# Patient Record
Sex: Female | Born: 1970
Health system: Southern US, Community
[De-identification: ages and names within clinical notes are randomized; demographics above are authoritative.]

## PROBLEM LIST (undated history)

## (undated) DIAGNOSIS — R112 Nausea with vomiting, unspecified: Secondary | ICD-10-CM

## (undated) DIAGNOSIS — Z9889 Other specified postprocedural states: Secondary | ICD-10-CM

## (undated) DIAGNOSIS — N83209 Unspecified ovarian cyst, unspecified side: Secondary | ICD-10-CM

## (undated) DIAGNOSIS — I471 Supraventricular tachycardia: Secondary | ICD-10-CM

## (undated) DIAGNOSIS — R5383 Other fatigue: Secondary | ICD-10-CM

## (undated) DIAGNOSIS — I4719 Other supraventricular tachycardia: Secondary | ICD-10-CM

## (undated) DIAGNOSIS — E2749 Other adrenocortical insufficiency: Secondary | ICD-10-CM

## (undated) DIAGNOSIS — J45909 Unspecified asthma, uncomplicated: Secondary | ICD-10-CM

## (undated) HISTORY — PX: TUBAL LIGATION: SHX77

## (undated) HISTORY — PX: OTHER SURGICAL HISTORY: SHX169

## (undated) HISTORY — PX: BREAST SURGERY: SHX581

## (undated) HISTORY — PX: CARDIAC ELECTROPHYSIOLOGY STUDY AND ABLATION: SHX1294

## (undated) HISTORY — PX: ANTERIOR CRUCIATE LIGAMENT REPAIR: SHX115

## (undated) HISTORY — PX: MYRINGOTOMY: SUR874

## (undated) HISTORY — PX: TONSILLECTOMY AND ADENOIDECTOMY: SUR1326

## (undated) HISTORY — PX: WISDOM TOOTH EXTRACTION: SHX21

## (undated) HISTORY — PX: NASAL SEPTUM SURGERY: SHX37

---

## 1997-09-22 ENCOUNTER — Other Ambulatory Visit: Admission: RE | Admit: 1997-09-22 | Discharge: 1997-09-22 | Payer: Self-pay | Admitting: Obstetrics & Gynecology

## 1999-10-01 ENCOUNTER — Other Ambulatory Visit: Admission: RE | Admit: 1999-10-01 | Discharge: 1999-10-01 | Payer: Self-pay | Admitting: Obstetrics and Gynecology

## 1999-11-21 ENCOUNTER — Inpatient Hospital Stay (HOSPITAL_COMMUNITY): Admission: EM | Admit: 1999-11-21 | Discharge: 1999-11-24 | Payer: Self-pay

## 1999-11-21 ENCOUNTER — Encounter: Payer: Self-pay | Admitting: Surgery

## 1999-11-21 ENCOUNTER — Encounter: Payer: Self-pay | Admitting: Emergency Medicine

## 1999-11-22 ENCOUNTER — Encounter: Payer: Self-pay | Admitting: General Surgery

## 1999-11-23 ENCOUNTER — Encounter: Payer: Self-pay | Admitting: General Surgery

## 1999-11-29 ENCOUNTER — Encounter: Payer: Self-pay | Admitting: Neurosurgery

## 1999-11-29 ENCOUNTER — Ambulatory Visit (HOSPITAL_COMMUNITY): Admission: RE | Admit: 1999-11-29 | Discharge: 1999-11-29 | Payer: Self-pay | Admitting: Neurosurgery

## 1999-12-19 ENCOUNTER — Ambulatory Visit (HOSPITAL_COMMUNITY): Admission: RE | Admit: 1999-12-19 | Discharge: 1999-12-19 | Payer: Self-pay | Admitting: Neurosurgery

## 1999-12-19 ENCOUNTER — Encounter: Payer: Self-pay | Admitting: Neurosurgery

## 2000-01-04 ENCOUNTER — Encounter: Payer: Self-pay | Admitting: Orthopedic Surgery

## 2000-01-04 ENCOUNTER — Ambulatory Visit (HOSPITAL_COMMUNITY): Admission: RE | Admit: 2000-01-04 | Discharge: 2000-01-04 | Payer: Self-pay | Admitting: Orthopedic Surgery

## 2000-07-21 ENCOUNTER — Encounter: Payer: Self-pay | Admitting: Obstetrics and Gynecology

## 2000-07-21 ENCOUNTER — Ambulatory Visit (HOSPITAL_COMMUNITY): Admission: RE | Admit: 2000-07-21 | Discharge: 2000-07-21 | Payer: Self-pay | Admitting: Obstetrics and Gynecology

## 2000-09-30 ENCOUNTER — Ambulatory Visit (HOSPITAL_COMMUNITY): Admission: RE | Admit: 2000-09-30 | Discharge: 2000-09-30 | Payer: Self-pay | Admitting: Obstetrics and Gynecology

## 2000-09-30 ENCOUNTER — Inpatient Hospital Stay (HOSPITAL_COMMUNITY): Admission: AD | Admit: 2000-09-30 | Discharge: 2000-09-30 | Payer: Self-pay | Admitting: *Deleted

## 2000-11-16 ENCOUNTER — Inpatient Hospital Stay (HOSPITAL_COMMUNITY): Admission: AD | Admit: 2000-11-16 | Discharge: 2000-11-16 | Payer: Self-pay | Admitting: Obstetrics and Gynecology

## 2000-11-21 ENCOUNTER — Inpatient Hospital Stay (HOSPITAL_COMMUNITY): Admission: AD | Admit: 2000-11-21 | Discharge: 2000-11-21 | Payer: Self-pay | Admitting: Obstetrics and Gynecology

## 2000-11-29 ENCOUNTER — Inpatient Hospital Stay (HOSPITAL_COMMUNITY): Admission: AD | Admit: 2000-11-29 | Discharge: 2000-12-01 | Payer: Self-pay | Admitting: Obstetrics and Gynecology

## 2000-12-04 ENCOUNTER — Encounter: Admission: RE | Admit: 2000-12-04 | Discharge: 2000-12-17 | Payer: Self-pay | Admitting: Obstetrics and Gynecology

## 2001-01-15 ENCOUNTER — Other Ambulatory Visit: Admission: RE | Admit: 2001-01-15 | Discharge: 2001-01-15 | Payer: Self-pay | Admitting: Obstetrics and Gynecology

## 2001-07-31 ENCOUNTER — Ambulatory Visit (HOSPITAL_COMMUNITY): Admission: RE | Admit: 2001-07-31 | Discharge: 2001-07-31 | Payer: Self-pay | Admitting: Neurosurgery

## 2001-07-31 ENCOUNTER — Encounter: Payer: Self-pay | Admitting: Neurosurgery

## 2001-09-25 ENCOUNTER — Ambulatory Visit (HOSPITAL_COMMUNITY): Admission: RE | Admit: 2001-09-25 | Discharge: 2001-09-25 | Payer: Self-pay | Admitting: Obstetrics and Gynecology

## 2001-09-25 ENCOUNTER — Encounter: Payer: Self-pay | Admitting: Obstetrics and Gynecology

## 2002-11-25 ENCOUNTER — Encounter: Payer: Self-pay | Admitting: Emergency Medicine

## 2002-11-25 ENCOUNTER — Emergency Department (HOSPITAL_COMMUNITY): Admission: EM | Admit: 2002-11-25 | Discharge: 2002-11-25 | Payer: Self-pay | Admitting: Emergency Medicine

## 2003-01-03 ENCOUNTER — Other Ambulatory Visit: Admission: RE | Admit: 2003-01-03 | Discharge: 2003-01-03 | Payer: Self-pay | Admitting: Obstetrics and Gynecology

## 2003-04-27 ENCOUNTER — Inpatient Hospital Stay (HOSPITAL_COMMUNITY): Admission: AD | Admit: 2003-04-27 | Discharge: 2003-04-27 | Payer: Self-pay | Admitting: Obstetrics and Gynecology

## 2003-05-03 ENCOUNTER — Ambulatory Visit (HOSPITAL_COMMUNITY): Admission: RE | Admit: 2003-05-03 | Discharge: 2003-05-03 | Payer: Self-pay | Admitting: Obstetrics and Gynecology

## 2003-05-29 ENCOUNTER — Inpatient Hospital Stay (HOSPITAL_COMMUNITY): Admission: AD | Admit: 2003-05-29 | Discharge: 2003-05-29 | Payer: Self-pay | Admitting: Obstetrics and Gynecology

## 2003-06-18 ENCOUNTER — Inpatient Hospital Stay (HOSPITAL_COMMUNITY): Admission: AD | Admit: 2003-06-18 | Discharge: 2003-06-18 | Payer: Self-pay | Admitting: Obstetrics and Gynecology

## 2003-06-22 ENCOUNTER — Inpatient Hospital Stay (HOSPITAL_COMMUNITY): Admission: AD | Admit: 2003-06-22 | Discharge: 2003-06-25 | Payer: Self-pay | Admitting: Obstetrics and Gynecology

## 2005-12-27 ENCOUNTER — Other Ambulatory Visit: Admission: RE | Admit: 2005-12-27 | Discharge: 2005-12-27 | Payer: Self-pay | Admitting: Obstetrics and Gynecology

## 2006-06-27 ENCOUNTER — Ambulatory Visit: Payer: Self-pay | Admitting: Family Medicine

## 2006-07-07 ENCOUNTER — Telehealth: Payer: Self-pay | Admitting: Family Medicine

## 2006-07-17 ENCOUNTER — Ambulatory Visit: Payer: Self-pay | Admitting: Family Medicine

## 2006-07-17 LAB — CONVERTED CEMR LAB
Chlamydia, DNA Probe: NEGATIVE
Clue Cells Wet Prep HPF POC: NONE SEEN
GC Probe Amp, Genital: NEGATIVE
Trich, Wet Prep: NONE SEEN
Yeast Wet Prep HPF POC: NONE SEEN

## 2006-07-18 ENCOUNTER — Telehealth (INDEPENDENT_AMBULATORY_CARE_PROVIDER_SITE_OTHER): Payer: Self-pay | Admitting: *Deleted

## 2006-07-22 ENCOUNTER — Telehealth: Payer: Self-pay | Admitting: Family Medicine

## 2006-07-23 ENCOUNTER — Telehealth (INDEPENDENT_AMBULATORY_CARE_PROVIDER_SITE_OTHER): Payer: Self-pay | Admitting: *Deleted

## 2006-09-08 ENCOUNTER — Ambulatory Visit: Payer: Self-pay | Admitting: Family Medicine

## 2006-09-08 LAB — CONVERTED CEMR LAB: Rapid Strep: NEGATIVE

## 2006-09-10 ENCOUNTER — Telehealth: Payer: Self-pay | Admitting: Family Medicine

## 2006-09-29 ENCOUNTER — Ambulatory Visit: Payer: Self-pay | Admitting: Family Medicine

## 2006-12-18 ENCOUNTER — Encounter: Payer: Self-pay | Admitting: Family Medicine

## 2007-01-12 ENCOUNTER — Encounter: Admission: RE | Admit: 2007-01-12 | Discharge: 2007-01-12 | Payer: Self-pay | Admitting: Family Medicine

## 2007-01-12 ENCOUNTER — Ambulatory Visit: Payer: Self-pay | Admitting: Family Medicine

## 2007-01-14 ENCOUNTER — Telehealth: Payer: Self-pay | Admitting: Family Medicine

## 2007-02-13 ENCOUNTER — Telehealth: Payer: Self-pay | Admitting: Family Medicine

## 2007-02-16 ENCOUNTER — Encounter: Payer: Self-pay | Admitting: Family Medicine

## 2007-02-17 ENCOUNTER — Telehealth: Payer: Self-pay | Admitting: Family Medicine

## 2007-03-17 ENCOUNTER — Ambulatory Visit: Payer: Self-pay | Admitting: Family Medicine

## 2007-03-17 LAB — CONVERTED CEMR LAB
Glucose, Urine, Semiquant: NEGATIVE
Nitrite: NEGATIVE
Protein, U semiquant: >=300
Specific Gravity, Urine: >=1.03
Urobilinogen, UA: NEGATIVE
pH: 6

## 2007-03-24 ENCOUNTER — Ambulatory Visit: Payer: Self-pay | Admitting: Obstetrics & Gynecology

## 2007-03-24 ENCOUNTER — Ambulatory Visit: Payer: Self-pay | Admitting: Physician Assistant

## 2007-05-26 ENCOUNTER — Ambulatory Visit: Payer: Self-pay | Admitting: Family Medicine

## 2007-05-27 ENCOUNTER — Telehealth: Payer: Self-pay | Admitting: Family Medicine

## 2007-05-27 ENCOUNTER — Encounter: Payer: Self-pay | Admitting: Family Medicine

## 2007-05-28 ENCOUNTER — Telehealth: Payer: Self-pay | Admitting: Family Medicine

## 2007-08-06 ENCOUNTER — Emergency Department (HOSPITAL_COMMUNITY): Admission: EM | Admit: 2007-08-06 | Discharge: 2007-08-06 | Payer: Self-pay | Admitting: Emergency Medicine

## 2007-09-02 ENCOUNTER — Encounter: Admission: RE | Admit: 2007-09-02 | Discharge: 2007-09-02 | Payer: Self-pay | Admitting: Obstetrics & Gynecology

## 2007-10-01 ENCOUNTER — Ambulatory Visit: Payer: Self-pay | Admitting: Family Medicine

## 2007-10-01 LAB — CONVERTED CEMR LAB
Glucose, Urine, Semiquant: 100
Nitrite: POSITIVE
Protein, U semiquant: 100
Specific Gravity, Urine: 1.015
Urobilinogen, UA: 2
pH: 8.5

## 2007-10-02 ENCOUNTER — Encounter: Payer: Self-pay | Admitting: Family Medicine

## 2007-11-23 ENCOUNTER — Ambulatory Visit: Payer: Self-pay | Admitting: Family Medicine

## 2007-11-24 ENCOUNTER — Encounter: Payer: Self-pay | Admitting: Family Medicine

## 2007-11-24 LAB — CONVERTED CEMR LAB
Clue Cells Wet Prep HPF POC: NONE SEEN
Trich, Wet Prep: NONE SEEN

## 2007-12-22 ENCOUNTER — Ambulatory Visit: Payer: Self-pay | Admitting: Family Medicine

## 2007-12-22 DIAGNOSIS — M546 Pain in thoracic spine: Secondary | ICD-10-CM | POA: Insufficient documentation

## 2007-12-24 ENCOUNTER — Encounter: Admission: RE | Admit: 2007-12-24 | Discharge: 2008-02-10 | Payer: Self-pay | Admitting: Family Medicine

## 2007-12-30 ENCOUNTER — Encounter: Payer: Self-pay | Admitting: Family Medicine

## 2008-01-04 ENCOUNTER — Encounter: Payer: Self-pay | Admitting: Family Medicine

## 2008-01-13 ENCOUNTER — Emergency Department (HOSPITAL_COMMUNITY): Admission: EM | Admit: 2008-01-13 | Discharge: 2008-01-13 | Payer: Self-pay | Admitting: Emergency Medicine

## 2008-01-14 ENCOUNTER — Ambulatory Visit (HOSPITAL_COMMUNITY): Admission: RE | Admit: 2008-01-14 | Discharge: 2008-01-14 | Payer: Self-pay | Admitting: Obstetrics and Gynecology

## 2008-03-07 ENCOUNTER — Ambulatory Visit: Payer: Self-pay | Admitting: Family Medicine

## 2008-03-07 DIAGNOSIS — N390 Urinary tract infection, site not specified: Secondary | ICD-10-CM | POA: Insufficient documentation

## 2008-03-07 LAB — CONVERTED CEMR LAB
Bilirubin Urine: NEGATIVE
Glucose, Urine, Semiquant: 100
Nitrite: POSITIVE
Protein, U semiquant: 100
Specific Gravity, Urine: 1.01
Urobilinogen, UA: 1
pH: 7

## 2008-04-29 ENCOUNTER — Ambulatory Visit: Payer: Self-pay | Admitting: Family Medicine

## 2008-05-01 ENCOUNTER — Encounter: Payer: Self-pay | Admitting: Family Medicine

## 2008-06-17 ENCOUNTER — Encounter: Payer: Self-pay | Admitting: Family Medicine

## 2008-06-29 ENCOUNTER — Encounter: Payer: Self-pay | Admitting: Family Medicine

## 2008-07-04 ENCOUNTER — Ambulatory Visit (HOSPITAL_COMMUNITY): Payer: Self-pay | Admitting: Psychiatry

## 2008-08-17 ENCOUNTER — Ambulatory Visit: Payer: Self-pay | Admitting: Family Medicine

## 2008-08-18 LAB — CONVERTED CEMR LAB
Basophils Absolute: 0.1 10*3/uL (ref 0.0–0.1)
Basophils Relative: 1 % (ref 0–1)
Eosinophils Absolute: 0.1 10*3/uL (ref 0.0–0.7)
Eosinophils Relative: 1 % (ref 0–5)
Free T4: 1.01 ng/dL (ref 0.89–1.80)
HCT: 41.9 % (ref 36.0–46.0)
Hemoglobin: 13.6 g/dL (ref 12.0–15.0)
Lymphocytes Relative: 10 % — ABNORMAL LOW (ref 12–46)
Lymphs Abs: 1.1 10*3/uL (ref 0.7–4.0)
MCHC: 32.5 g/dL (ref 30.0–36.0)
MCV: 89 fL (ref 78.0–100.0)
Monocytes Absolute: 0.8 10*3/uL (ref 0.1–1.0)
Monocytes Relative: 8 % (ref 3–12)
Neutro Abs: 8.3 10*3/uL — ABNORMAL HIGH (ref 1.7–7.7)
Neutrophils Relative %: 81 % — ABNORMAL HIGH (ref 43–77)
Platelets: 286 10*3/uL (ref 150–400)
RBC: 4.71 M/uL (ref 3.87–5.11)
RDW: 13.9 % (ref 11.5–15.5)
T3, Free: 3 pg/mL (ref 2.3–4.2)
TSH: 1.289 microintl units/mL (ref 0.350–4.500)
WBC: 10.3 10*3/uL (ref 4.0–10.5)

## 2008-08-25 ENCOUNTER — Ambulatory Visit (HOSPITAL_COMMUNITY): Payer: Self-pay | Admitting: Psychiatry

## 2008-09-09 ENCOUNTER — Encounter: Payer: Self-pay | Admitting: Family Medicine

## 2008-10-20 ENCOUNTER — Ambulatory Visit: Payer: Self-pay | Admitting: Family Medicine

## 2008-10-20 LAB — CONVERTED CEMR LAB
Bilirubin Urine: NEGATIVE
Blood in Urine, dipstick: NEGATIVE
Glucose, Urine, Semiquant: NEGATIVE
Ketones, urine, test strip: NEGATIVE
Nitrite: NEGATIVE
Specific Gravity, Urine: 1.015
Urobilinogen, UA: 0.2
WBC Urine, dipstick: NEGATIVE
pH: 8.5

## 2008-10-21 ENCOUNTER — Encounter: Payer: Self-pay | Admitting: Family Medicine

## 2008-10-21 ENCOUNTER — Encounter: Admission: RE | Admit: 2008-10-21 | Discharge: 2008-10-21 | Payer: Self-pay | Admitting: Family Medicine

## 2008-10-21 LAB — CONVERTED CEMR LAB
RBC / HPF: NONE SEEN (ref <3)
WBC, UA: NONE SEEN cells/hpf (ref <3)

## 2008-12-23 ENCOUNTER — Ambulatory Visit: Payer: Self-pay | Admitting: Family Medicine

## 2008-12-23 ENCOUNTER — Telehealth: Payer: Self-pay | Admitting: Family Medicine

## 2008-12-23 DIAGNOSIS — G2581 Restless legs syndrome: Secondary | ICD-10-CM | POA: Insufficient documentation

## 2008-12-23 DIAGNOSIS — G5600 Carpal tunnel syndrome, unspecified upper limb: Secondary | ICD-10-CM | POA: Insufficient documentation

## 2008-12-24 ENCOUNTER — Encounter: Payer: Self-pay | Admitting: Family Medicine

## 2008-12-26 LAB — CONVERTED CEMR LAB
ALT: 9 units/L (ref 0–35)
AST: 21 units/L (ref 0–37)
Albumin: 4.5 g/dL (ref 3.5–5.2)
Alkaline Phosphatase: 52 units/L (ref 39–117)
BUN: 10 mg/dL (ref 6–23)
CO2: 28 meq/L (ref 19–32)
Calcium: 9.3 mg/dL (ref 8.4–10.5)
Chloride: 103 meq/L (ref 96–112)
Creatinine, Ser: 0.81 mg/dL (ref 0.40–1.20)
Glucose, Bld: 89 mg/dL (ref 70–99)
HCT: 42.3 % (ref 36.0–46.0)
Hemoglobin: 14.2 g/dL (ref 12.0–15.0)
IgE (Immunoglobulin E), Serum: 25.5 intl units/mL (ref 0.0–180.0)
MCHC: 33.6 g/dL (ref 30.0–36.0)
MCV: 90 fL (ref 78.0–100.0)
Magnesium: 2 mg/dL (ref 1.5–2.5)
Platelets: 295 10*3/uL (ref 150–400)
Potassium: 3.6 meq/L (ref 3.5–5.3)
RBC: 4.7 M/uL (ref 3.87–5.11)
RDW: 13.7 % (ref 11.5–15.5)
Sed Rate: 2 mm/hr (ref 0–22)
Sodium: 140 meq/L (ref 135–145)
Total Bilirubin: 0.4 mg/dL (ref 0.3–1.2)
Total Protein: 7.2 g/dL (ref 6.0–8.3)
Vitamin B-12: 1061 pg/mL — ABNORMAL HIGH (ref 211–911)
WBC: 7.7 10*3/uL (ref 4.0–10.5)

## 2008-12-27 ENCOUNTER — Ambulatory Visit (HOSPITAL_COMMUNITY): Payer: Self-pay | Admitting: Psychiatry

## 2009-01-19 ENCOUNTER — Encounter: Payer: Self-pay | Admitting: Family Medicine

## 2009-02-27 ENCOUNTER — Encounter: Admission: RE | Admit: 2009-02-27 | Discharge: 2009-03-16 | Payer: Self-pay | Admitting: Specialist

## 2009-04-18 ENCOUNTER — Ambulatory Visit: Payer: Self-pay | Admitting: Family Medicine

## 2009-08-23 ENCOUNTER — Encounter: Payer: Self-pay | Admitting: Family Medicine

## 2009-09-04 ENCOUNTER — Ambulatory Visit: Payer: Self-pay | Admitting: Family Medicine

## 2009-09-04 DIAGNOSIS — Z862 Personal history of diseases of the blood and blood-forming organs and certain disorders involving the immune mechanism: Secondary | ICD-10-CM | POA: Insufficient documentation

## 2009-09-04 DIAGNOSIS — Z8639 Personal history of other endocrine, nutritional and metabolic disease: Secondary | ICD-10-CM | POA: Insufficient documentation

## 2009-09-04 DIAGNOSIS — H6690 Otitis media, unspecified, unspecified ear: Secondary | ICD-10-CM | POA: Insufficient documentation

## 2009-09-04 DIAGNOSIS — R002 Palpitations: Secondary | ICD-10-CM | POA: Insufficient documentation

## 2009-09-04 DIAGNOSIS — R0602 Shortness of breath: Secondary | ICD-10-CM | POA: Insufficient documentation

## 2009-09-04 DIAGNOSIS — J45909 Unspecified asthma, uncomplicated: Secondary | ICD-10-CM | POA: Insufficient documentation

## 2009-09-05 LAB — CONVERTED CEMR LAB
BUN: 13 mg/dL (ref 6–23)
Basophils Absolute: 0.1 10*3/uL (ref 0.0–0.1)
Basophils Relative: 1 % (ref 0–1)
CO2: 25 meq/L (ref 19–32)
Calcium: 9.1 mg/dL (ref 8.4–10.5)
Chloride: 99 meq/L (ref 96–112)
Creatinine, Ser: 0.77 mg/dL (ref 0.40–1.20)
Eosinophils Absolute: 0.1 10*3/uL (ref 0.0–0.7)
Eosinophils Relative: 1 % (ref 0–5)
Glucose, Bld: 82 mg/dL (ref 70–99)
HCT: 44.2 % (ref 36.0–46.0)
Hemoglobin: 14.5 g/dL (ref 12.0–15.0)
Lymphocytes Relative: 19 % (ref 12–46)
Lymphs Abs: 1.9 10*3/uL (ref 0.7–4.0)
MCHC: 32.8 g/dL (ref 30.0–36.0)
MCV: 90.6 fL (ref 78.0–100.0)
Monocytes Absolute: 1.4 10*3/uL — ABNORMAL HIGH (ref 0.1–1.0)
Monocytes Relative: 13 % — ABNORMAL HIGH (ref 3–12)
Neutro Abs: 6.8 10*3/uL (ref 1.7–7.7)
Neutrophils Relative %: 67 % (ref 43–77)
Platelets: 310 10*3/uL (ref 150–400)
Potassium: 3.8 meq/L (ref 3.5–5.3)
RBC: 4.88 M/uL (ref 3.87–5.11)
RDW: 14 % (ref 11.5–15.5)
Sodium: 136 meq/L (ref 135–145)
TSH: 1.65 microintl units/mL (ref 0.350–4.500)
WBC: 10.2 10*3/uL (ref 4.0–10.5)

## 2010-01-11 ENCOUNTER — Encounter: Payer: Self-pay | Admitting: Family Medicine

## 2010-01-17 ENCOUNTER — Encounter: Payer: Self-pay | Admitting: Family Medicine

## 2010-02-08 ENCOUNTER — Encounter: Payer: Self-pay | Admitting: Family Medicine

## 2010-03-13 ENCOUNTER — Ambulatory Visit: Payer: Self-pay | Admitting: Family Medicine

## 2010-03-13 LAB — CONVERTED CEMR LAB
Bilirubin Urine: NEGATIVE
Glucose, Urine, Semiquant: 100
Nitrite: POSITIVE
Protein, U semiquant: NEGATIVE
Specific Gravity, Urine: <1.005
Urobilinogen, UA: 1
pH: 6

## 2010-05-23 ENCOUNTER — Encounter: Payer: Self-pay | Admitting: Family Medicine

## 2010-06-11 ENCOUNTER — Encounter: Payer: Self-pay | Admitting: Family Medicine

## 2010-06-17 LAB — CONVERTED CEMR LAB
Bilirubin Urine: NEGATIVE
Blood in Urine, dipstick: NEGATIVE
Glucose, Urine, Semiquant: NEGATIVE
Ketones, urine, test strip: NEGATIVE
Nitrite: NEGATIVE
Protein, U semiquant: NEGATIVE
Specific Gravity, Urine: 1.02
Urobilinogen, UA: 0.2
WBC Urine, dipstick: NEGATIVE
pH: 7

## 2010-06-19 NOTE — Letter (Signed)
Summary: [facility]  [facility]   Imported By: Lanelle Bal 07/04/2008 08:22:36  _____________________________________________________________________  External Attachment:    Type:   Image     Comment:   External Document

## 2010-06-19 NOTE — Letter (Signed)
Summary: COUNSELOR REPORT  COUNSELOR REPORT   Imported By: Harlene Salts 03/11/2007 16:45:45  _____________________________________________________________________  External Attachment:    Type:   Image     Comment:   External Document

## 2010-06-19 NOTE — Assessment & Plan Note (Signed)
Summary: sinus infection   Vital Signs:  Patient profile:   40 year old female Height:      65 inches Weight:      141 pounds BMI:     23.55 O2 Sat:      99 % on Room air Temp:     99.3 degrees F oral Pulse rate:   69 / minute BP sitting:   111 / 72  (left arm) Cuff size:   regular  Vitals Entered By: Payton Spark CMA (April 18, 2009 2:49 PM)  O2 Flow:  Room air CC: Fever, aches, head congestion, HA and facial pressure x 5 days.    Primary Care Provider:  Seymour Bars DO  CC:  Fever, aches, head congestion, and HA and facial pressure x 5 days. Marland Kitchen  History of Present Illness: 40 yo WF presents for 5 days of chest congestion, runny nose, HA, bodyaches, earaches.  Subjective fevers.  Theraflu and Advil helping.  She has ear fullness.  She is having intense Head pressure.  She has a lot of mucous congestion.  Even with the meds, it has not cleared her up.  She does not smoke.  Her cough is keeping her up at night.  Current Medications (verified): 1)  None  Allergies (verified): 1)  ! Demerol  Past History:  Past Medical History: Reviewed history from 08/17/2008 and no changes required. PAT-- sees Dr Clovis Riley at [facility] PFO/ ASD pap 9-07 in [place]  Past Surgical History: Reviewed history from 12/23/2008 and no changes required. abdominoplasthy  T- tubes, T&A ganglion cyst removal from left wrist  Family History: Reviewed history from 11/23/2007 and no changes required. GM- breast cancer at 76 Mother- MVP Father PTSD, bipolar M aunt breast cancer at 19  Social History: Reviewed history from 11/23/2007 and no changes required. Self employed.  Divorced.  4 kids.  (3 live w/ her).  Boyfriend - Barbara Cower.  Exercises regularly.  Nonsmoker.  1-2 ETOH drinks/ day.  Review of Systems      See HPI  Physical Exam  General:  alert, well-developed, well-nourished, and well-hydrated.   Head:  normocephalic and atraumatic.  frontal sinuses TTP Eyes:   conjunctiva clear Ears:  R TM scarred with purulence behind the TM and lack of bony landmarks, no injection. L TM with scaring and retracted o/w normal Nose:  nasal congestion with boggy turbinates present Mouth:  throat mildly injected Neck:  no masses.   Lungs:  Normal respiratory effort, chest expands symmetrically. Lungs are clear to auscultation, no crackles or wheezes. Heart:  Normal rate and regular rhythm. S1 and S2 normal without gallop, murmur, click, rub or other extra sounds. Skin:  color normal and no rashes.   Cervical Nodes:  shotty tender anterior cervical chain LNs   Impression & Recommendations:  Problem # 1:  ACUTE SINUSITIS, UNSPECIFIED (ICD-461.9) Treat ABS and R AOM with Amox x 10 days in addition to supportive care measures, OTC Mucinex- D, Advil and Delsym.  Treat nighttime cough with Promethazine with Codeine. Her updated medication list for this problem includes:    Amoxicillin 875 Mg Tabs (Amoxicillin) .Marland Kitchen... 1 tab by mouth q 12 hrs x 10 days    Promethazine-codeine 6.25-10 Mg/25ml Syrp (Promethazine-codeine) .Marland Kitchen... 1 tsp by mouth q hs  Problem # 2:  ROM (ICD-382.9) As per #1. Her updated medication list for this problem includes:    Amoxicillin 875 Mg Tabs (Amoxicillin) .Marland Kitchen... 1 tab by mouth q 12 hrs x 10 days  Complete Medication List: 1)  Amoxicillin 875 Mg Tabs (Amoxicillin) .Marland Kitchen.. 1 tab by mouth q 12 hrs x 10 days 2)  Promethazine-codeine 6.25-10 Mg/69ml Syrp (Promethazine-codeine) .Marland Kitchen.. 1 tsp by mouth q hs  Patient Instructions: 1)  Take 10 days of Amoxicillin with breakfast and dinner for sinusiits and R ear infection. 2)  Use Mucinex D for congestion. 3)  Use Advil as needed for aches and pain. 4)  Take Delsym during the day if needed for cough and RX cough syrup at night. 5)  Call if not improved after 10 days. Prescriptions: PROMETHAZINE-CODEINE 6.25-10 MG/5ML SYRP (PROMETHAZINE-CODEINE) 1 tsp by mouth q hs  #100 ml x 0   Entered and Authorized by:    Seymour Bars DO   Signed by:   Seymour Bars DO on 04/18/2009   Method used:   Printed then faxed to ...       [facility]* (retail)       [address] [place], [place]  [address]       Ph: [phone]       Fax: [phone]   RxID:   [id number] AMOXICILLIN 875 MG TABS (AMOXICILLIN) 1 tab by mouth q 12 hrs x 10 days  #20 x 0   Entered and Authorized by:   Seymour Bars DO   Signed by:   Seymour Bars DO on 04/18/2009   Method used:   Electronically to        [facility]* (retail)       [address] [place], [place]  [address]       Ph: [phone]       Fax: [phone]   RxID:   [id number]

## 2010-06-19 NOTE — Assessment & Plan Note (Signed)
Summary: cervical lymphadenopathy   Vital Signs:  Patient profile:   40 year old female Height:      65 inches Weight:      130 pounds Temp:     97.1 degrees F oral Pulse rate:   91 / minute BP sitting:   111 / 69  (left arm) Cuff size:   regular  Vitals Entered By: Harlene Salts (August 17, 2008 1:22 PM)  History of Present Illness: swollen glands in neck,body ache,small headache,not feeling well  40 yo WF with hot flashes and nighweats x 1 wk.  Describes the feeling of a   Lump in throat feeling.  Swollen glands in neck.  R side is worse.  Hurts to swallow.  No fever.  No bodyaches.  No congestion.  No cough.  Mild HA pain.  Denies increased stress.  Chronic problems with heart palpitations.  New med x 2 months: Flecanide per Dr Sherren Kerns.  Had TEE in Feb and was dx'd with an ASD.  Has f/u with him in April.  Periods regular.    Current Medications (verified): 1)  Ibuprofen 800 Mg Tabs (Ibuprofen) .Marland Kitchen.. 1 By Mouth Three Times A Day Pc 2)  Flecainide Acetate 100 Mg Tabs (Flecainide Acetate) .... Take 1 Tablet By Mouth Two Times A Day 3)  Strattera 40 Mg Caps (Atomoxetine Hcl) .... Take 1 Tablet By Mouth Once A Day  Allergies: No Known Drug Allergies  Past History:  Past Medical History:    PAT-- sees Dr Clovis Riley at [facility]    PFO/ ASD    pap 9-07 in [place]  Past Surgical History:    Reviewed history from 06/27/2006 and no changes required:    abdominoplasthy     T- tubes, T&A    ganglion cyst removal  Family History:    Reviewed history from 11/23/2007 and no changes required:       GM- breast cancer at 51       Mother- MVP       Father PTSD, bipolar       M aunt breast cancer at 24  Social History:    Reviewed history from 11/23/2007 and no changes required:       Self employed.  Divorced.  4 kids.  (3 live w/ her).  Boyfriend - Barbara Cower.  Exercises regularly.  Nonsmoker.  1-2 ETOH drinks/ day.  Review of Systems      See HPI  Physical  Exam  General:  alert, well-developed, well-nourished, and well-hydrated.   Head:  normocephalic and atraumatic.  sinuses NTTP Eyes:  conjunctiva clear Ears:  EACs patent; TMs translucent and gray with good cone of light and bony landmarks.  Nose:  no external deformity and no nasal discharge.   Mouth:  good dentition.  o/p pink, a little dry Neck:  supple.  no thyromegaly  Lungs:  Normal respiratory effort, chest expands symmetrically. Lungs are clear to auscultation, no crackles or wheezes. Heart:  normal rate, regular rhythm, and no murmur.   Skin:  color normal.   Cervical Nodes:  enlarged R>L submandibular LA, tender no redness Psych:  good eye contact, not anxious appearing, and not depressed appearing.     Impression & Recommendations:  Problem # 1:  CERVICAL LYMPHADENOPATHY, RIGHT (ICD-785.6) R>L submandibular LA x 1 wk w/o sign of pharyngitis.  Check CBC with diff for WBC count, look for a left shift.  DDX includes salivary gland stone, pharyngitis.   Orders: T-CBC  w/Diff [phone])  Problem # 2:  HOT FLASHES (ICD-627.2) Check thyroid labs.  Regular menses so unlikely to be hormonally driven.  Not on an SSRI.  Afebrile.  Will continue to follow. Orders: T-TSH [id number]) T-T4, Free [id number]) T-T3, Free [phone])  Complete Medication List: 1)  Ibuprofen 800 Mg Tabs (Ibuprofen) .Marland Kitchen.. 1 by mouth three times a day pc 2)  Flecainide Acetate 100 Mg Tabs (Flecainide acetate) .... Take 1 tablet by mouth two times a day 3)  Strattera 40 Mg Caps (Atomoxetine hcl) .... Take 1 tablet by mouth once a day  Patient Instructions: 1)  Labs today. 2)  I will call you w/ results tomorrow.

## 2010-06-19 NOTE — Letter (Signed)
Summary: [facility]  [facility]   Imported By: Lanelle Bal 03/09/2010 16:12:19  _____________________________________________________________________  External Attachment:    Type:   Image     Comment:   External Document

## 2010-06-19 NOTE — Letter (Signed)
Summary: [facility]  [facility]   Imported By: Lanelle Bal 09/28/2009 11:50:37  _____________________________________________________________________  External Attachment:    Type:   Image     Comment:   External Document

## 2010-06-19 NOTE — Progress Notes (Signed)
  Phone Note Outgoing Call   Call placed by: Harlene Salts,  July 18, 2006 11:47 AM Call placed to: Patient Reason for Call: Discuss lab or test results Summary of Call: Called and informed patient that all test were negative no sign of infection from GC/chlamydia and wet prep test. MD said the soap or BCP could be causing the change in odor or discharge. Patient verbalized understanding.  Initial call taken by: Harlene Salts,  July 18, 2006 11:49 AM

## 2010-06-19 NOTE — Progress Notes (Signed)
Summary: ANXIETY MED  Phone Note Call from Patient   Caller: Patient Call For: Kristen Bars DO Summary of Call: PATIENT WANT KLONOPIN RX FAXED TO [facility].  Initial call taken by: Harlene Salts,  February 17, 2007 11:18 AM    New/Updated Medications: KLONOPIN 0.5 MG  TABS (CLONAZEPAM) 1 tab by mouth three times a day as needed anxiety   Prescriptions: KLONOPIN 0.5 MG  TABS (CLONAZEPAM) 1 tab by mouth three times a day as needed anxiety  #30 x 0   Entered and Authorized by:   Kristen Bars DO   Signed by:   Kristen Bars DO on 02/17/2007   Method used:   Print then Give to Patient   RxID:   [id number]

## 2010-06-19 NOTE — Assessment & Plan Note (Signed)
Summary: UTI   Vital Signs:  Patient profile:   40 year old female Height:      65 inches Weight:      133 pounds Pulse rate:   98 / minute BP sitting:   110 / 78  (right arm) Cuff size:   regular  Vitals Entered By: Avon Gully CMA, Duncan Dull) (March 13, 2010 1:09 PM) CC:  urinary urgency,painful urination   Primary Care Provider:  Seymour Bars DO  CC:   urinary urgency and painful urination.  History of Present Illness: Sxs started last night with freq urination and dysuria. Passing blood clots in the urine.  Hx of UTI previously. Started after she got her Mirena. No longer has the mirena in. Self treating with AZO. NO fever.  + odor. No vag d/c.  No back pain.   Current Medications (verified): 1)  Atenolol 25 Mg Tabs (Atenolol) .... Take One Tablet By Mouth Once A Day  Allergies (verified): 1)  ! Demerol  Comments:  Nurse/Medical Assistant: The patient's medications and allergies were reviewed with the patient and were updated in the Medication and Allergy Lists. Avon Gully CMA, Duncan Dull) (March 13, 2010 1:30 PM)  Physical Exam  General:  Well-developed,well-nourished,in no acute distress; alert,appropriate and cooperative throughout examination Msk:  NO CVA tenderness Skin:  no rashes.   Psych:  Cognition and judgment appear intact. Alert and cooperative with normal attention span and concentration. No apparent delusions, illusions, hallucinations   Impression & Recommendations:  Problem # 1:  UTI'S, RECURRENT (ICD-599.0)  The following medications were removed from the medication list:    Amoxicillin 875 Mg Tabs (Amoxicillin) .Marland Kitchen... 1 tab by mouth q 12 hrs x 10 days Her updated medication list for this problem includes:    Bactrim Ds 800-160 Mg Tabs (Sulfamethoxazole-trimethoprim) .Marland Kitchen... Take 1 tablet by mouth two times a day for 3 days  Orders: UA Dipstick w/o Micro (automated)  ([address]3)  Encouraged to push clear liquids, get enough rest, and take  acetaminophen as needed. To be seen in 5 days if no improvement, sooner if worse.  Complete Medication List: 1)  Atenolol 25 Mg Tabs (Atenolol) .... Take one tablet by mouth once a day 2)  Bactrim Ds 800-160 Mg Tabs (Sulfamethoxazole-trimethoprim) .... Take 1 tablet by mouth two times a day for 3 days 3)  Fluconazole 150 Mg Tabs (Fluconazole) .... Take 1 tablet by mouth once a day x 1  Patient Instructions: 1)  Call if not better in 3-5 days.  Prescriptions: FLUCONAZOLE 150 MG TABS (FLUCONAZOLE) Take 1 tablet by mouth once a day x 1  #1 x 1   Entered and Authorized by:   Nani Gasser MD   Signed by:   Nani Gasser MD on 03/13/2010   Method used:   Electronically to        [facility]* (retail)       [address] [place], [place]  [address]       Ph: [phone]       Fax: [phone]   RxID:   [id number] BACTRIM DS 800-160 MG TABS (SULFAMETHOXAZOLE-TRIMETHOPRIM) Take 1 tablet by mouth two times a day for 3 days  #6 x 0   Entered and Authorized by:   Nani Gasser MD   Signed by:   Nani Gasser MD on 03/13/2010   Method used:   Electronically to        [facility]* (retail)  [address] [place], [place]  [address]       Ph: [phone]       Fax: [phone]   RxID:   [id number]    Orders Added: 1)  UA Dipstick w/o Micro (automated)  [81003] 2)  Est. Patient Level II [[address]]    Laboratory Results   Urine Tests  Date/Time Received: 03/13/10 Date/Time Reported: 03/13/10  Routine Urinalysis   Color: orange Appearance: Clear Glucose: 100   (Normal Range: Negative) Bilirubin: negative   (Normal Range: Negative) Ketone: small (15)   (Normal Range: Negative) Spec. Gravity: <1.005   (Normal Range: 1.003-1.035) Blood: trace-intact   (Normal Range: Negative) pH: 6.0   (Normal Range: 5.0-8.0) Protein: negative   (Normal Range: Negative) Urobilinogen: 1.0   (Normal Range: 0-1) Nitrite: positive   (Normal Range:  Negative) Leukocyte Esterace: trace   (Normal Range: Negative)

## 2010-06-19 NOTE — Assessment & Plan Note (Signed)
Summary: UTI   Vital Signs:  Patient Profile:   40 Years Old Female Height:     65 inches Weight:      141 pounds Temp:     97.4 degrees F oral Pulse rate:   81 / minute BP sitting:   118 / 59  (left arm) Cuff size:   regular  Vitals Entered By: Harlene Salts (March 17, 2007 8:22 AM)                 Visit Type:  acute PCP:  Seymour Bars DO  Chief Complaint:  frequency and urgency.  History of Present Illness: 40 yo WF presents for 1 days of dysuria, urinary frequency and incomplete emptying.  Has had several UTI's in the past, but it has been a few years since last one.  Has some pelvic pressure, no flank pain, no nausea, vomitting or fevers.  Not on menses.  Still waiting to get IUD placed.  Using condoms.  Denies vag discharge.   Current Allergies: No known allergies      Review of Systems      See HPI   Physical Exam  General:     alert, well-developed, well-nourished, and well-hydrated.   Head:     normocephalic and atraumatic.   Eyes:     sclera non-icteric Mouth:     pharynx pink and moist.   Lungs:     normal respiratory effort, no accessory muscle use, and normal breath sounds.   Heart:     normal rate, regular rhythm, and no murmur.   Abdomen:     suprapubic TTP with volunary guarding, No CVAT.  soft, no rigidity, and no rebound tenderness.   Skin:     color normal and no rashes.      Impression & Recommendations:  Problem # 1:  INFECTION, URINARY TRACT NOS (ICD-599.0) Treat for uncomplicated UTI with Cipro for 3 days.  May use OTC AZO for comfort, cranberry juice and water.  Return if symptoms not improved in 3 days.   Her updated medication list for this problem includes:    Cipro 250 Mg Tabs (Ciprofloxacin hcl) .Marland Kitchen... 1 tab by mouth q 12 hrs x 3 days  Orders: Urinalysis-dipstick w/o micro ([address]1003)   Complete Medication List: 1)  Cardizem La 240 Mg Tb24 (Diltiazem hcl coated beads) .... Take 1 tablet by mouth once a day 2)  Antivert  25 Mg Tabs (Meclizine hcl) .Marland Kitchen.. 1-2 tabs by mouth two times a day as needed for vertigo 3)  Rythmol Sr 225 Mg Cp12 (Propafenone hcl) .... Take 1 tablet by mouth two times a day 4)  Klonopin 0.5 Mg Tabs (Clonazepam) .Marland Kitchen.. 1 tab by mouth three times a day as needed anxiety 5)  Cipro 250 Mg Tabs (Ciprofloxacin hcl) .Marland Kitchen.. 1 tab by mouth q 12 hrs x 3 days  Other Orders: Flu Vaccine 7yrs + ([address]) Admin 1st Vaccine ([address]) Gynecologic Referral (Gyn)   Patient Instructions: 1)  Take 3 days of Cipro. 2)  Drink plenty of water and cranberry juice. 3)  If still symptomatic by Friday, return for a repeat urine test. 4)  Isabelle Course will call you with gyn visit down the hall for IUD placement.    Prescriptions: CIPRO 250 MG  TABS (CIPROFLOXACIN HCL) 1 tab by mouth q 12 hrs x 3 days  #6 x 0   Entered and Authorized by:   Seymour Bars DO   Signed by:   Seymour Bars DO on 03/17/2007  Method used:   Electronically sent to ...       [facility]*       [address] [place], [place]  [address]       Ph: [phone]       Fax: [phone]   RxID:   [id number]  ] Laboratory Results   Urine Tests  Date/Time Recieved: March 17, 2007 8:23 AM  Date/Time Reported: March 17, 2007 8:23 AM   Routine Urinalysis   Color: brown Appearance: Cloudy Glucose: negative   (Normal Range: Negative) Bilirubin: moderate   (Normal Range: Negative) Ketone: smal (15)   (Normal Range: Negative) Spec. Gravity: >=1.030   (Normal Range: 1.003-1.035) Blood: large   (Normal Range: Negative) pH: 6.0   (Normal Range: 5.0-8.0) Protein: >=300   (Normal Range: Negative) Urobilinogen: negative   (Normal Range: 0-1) Nitrite: negative   (Normal Range: Negative) Leukocyte Esterace: small   (Normal Range: Negative)         Influenza Vaccine    Vaccine Type: Fluvax 3+    Site: left deltoid    Mfr: NOVARTIS    Dose: 0.5 ml    Route: IM    Given by: Harlene Salts    Exp. Date: 11/17/2007    Lot #:  [id number]    VIS given: 12/11/06  Flu Vaccine Consent Questions    Do you have a history of severe allergic reactions to this vaccine? no    Any prior history of allergic reactions to egg and/or gelatin? no    Do you have a sensitivity to the preservative Thimersol? no    Do you have a past history of Guillan-Barre Syndrome? no    Do you currently have an acute febrile illness? no    Have you ever had a severe reaction to latex? no    Vaccine information given and explained to patient? yes    Are you currently pregnant? no

## 2010-06-19 NOTE — Assessment & Plan Note (Signed)
Summary: UTI   Vital Signs:  Patient Profile:   40 Years Old Female Height:     65 inches Weight:      135 pounds BMI:     22.55 Temp:     97.2 degrees F oral Pulse rate:   70 / minute BP sitting:   107 / 73  (left arm) Cuff size:   regular  Vitals Entered By: Harlene Salts (March 07, 2008 11:00 AM)                Flu Vaccine Consent Questions     Do you have a history of severe allergic reactions to this vaccine? no    Any prior history of allergic reactions to egg and/or gelatin? no    Do you have a sensitivity to the preservative Thimersol? no    Do you have a past history of Guillan-Barre Syndrome? no    Do you currently have an acute febrile illness? no    Have you ever had a severe reaction to latex? no    Vaccine information given and explained to patient? yes    Are you currently pregnant? no    Lot Number:[id number]   Site Given  Left Deltoid IM   PCP:  Seymour Bars DO  Chief Complaint:  c/o low back pain and Dysuria.  History of Present Illness:  Dysuria      This is a 40 year old woman who presents with Dysuria.  The symptoms began duration 1-2 days ago.  The patient reports urinary frequency and hematuria, but denies burning with urination, urgency, and vaginal discharge.  Associated symptoms include nausea, fever, flank pain, and back pain.  The patient denies the following associated symptoms: vomiting, shaking chills, abdominal pain, and pelvic pain.  The patient denies the following risk factors: prior antibiotics.  History is significant for > 3 UTIs in one year.  Had Mirena out and BTL in August.  No new sexual partner.  No relation to interourse.      Current Allergies: No known allergies   Past Medical History:    Reviewed history from 07/01/2006 and no changes required:       PAT-- sees Dr Clovis Riley at [facility]       PFO       pap 9-07 in [place]   Social History:    Reviewed history from 11/23/2007 and no changes  required:       Self employed.  Divorced.  4 kids.  (3 live w/ her).  Boyfriend - Barbara Cower.  Exercises regularly.  Nonsmoker.  1-2 ETOH drinks/ day.    Review of Systems      See HPI   Physical Exam  General:     alert, well-developed, well-nourished, and well-hydrated.   Head:     normocephalic and atraumatic.   Eyes:     sclera non icteric Mouth:     good dentition and pharynx pink and moist.   Lungs:     Normal respiratory effort, chest expands symmetrically. Lungs are clear to auscultation, no crackles or wheezes. Heart:     Normal rate and regular rhythm. S1 and S2 normal without gallop, murmur, click, rub or other extra sounds. Abdomen:     suprapubic TTP no CVAT soft.   Extremities:     no LE edema Skin:     color normal.   Cervical Nodes:     No lymphadenopathy noted    Impression &  Recommendations:  Problem # 1:  ACUTE CYSTITIS (ICD-595.0) Treat with meds below and plenty of water/ cranberry juicie.  If not improving in 3 days, will collect urine for cx. Hx of recurrent UTI -  ~5 in 1 yr w/o new sexual partner.  If not improving or another recurds in < 3 months, refer to urology. Her updated medication list for this problem includes:    Ciprofloxacin Hcl 500 Mg Tabs (Ciprofloxacin hcl) .Marland Kitchen... 1 tab by mouth q 12 hrs x 3 days    Phenazopyridine Hcl 100 Mg Tabs (Phenazopyridine hcl) .Marland Kitchen... 1 tab by mouth three times a day x 2 days  Orders: UA Dipstick w/o Micro (automated)  (81003)   Complete Medication List: 1)  Cardizem La 240 Mg Tb24 (Diltiazem hcl coated beads) .... Take 1 tablet by mouth once a day 2)  Antivert 25 Mg Tabs (Meclizine hcl) .Marland Kitchen.. 1-2 tabs by mouth two times a day as needed for vertigo 3)  Rythmol Sr 225 Mg Cp12 (Propafenone hcl) .... Take 1 tablet by mouth two times a day 4)  Klonopin 0.5 Mg Tabs (Clonazepam) .Marland Kitchen.. 1 tab by mouth three times a day as needed anxiety 5)  Diflucan 150 Mg Tabs (Fluconazole) .Marland Kitchen.. 1 tab by mouth x 1, repeat in 3 days  if needed 6)  Flexeril 10 Mg Tab (Cyclobenzaprine hcl) .... Take one tablet by mouth three times a day 7)  Ciprofloxacin Hcl 500 Mg Tabs (Ciprofloxacin hcl) .Marland Kitchen.. 1 tab by mouth q 12 hrs x 3 days 8)  Phenazopyridine Hcl 100 Mg Tabs (Phenazopyridine hcl) .Marland Kitchen.. 1 tab by mouth three times a day x 2 days  Other Orders: Admin 1st Vaccine (25366) Flu Vaccine 9yrs + (44034)   Patient Instructions: 1)  Take 3 days of Cipro for UTI. 2)  Use Pyridium up to 3 x a day for discomfort. 3)  Drink plenty of water, cranberry juice. 4)  If not better by Thursday, return to have urine cultured.   Prescriptions: PHENAZOPYRIDINE HCL 100 MG TABS (PHENAZOPYRIDINE HCL) 1 tab by mouth three times a day x 2 days  #6 x 0   Entered and Authorized by:   Seymour Bars DO   Signed by:   Seymour Bars DO on 03/07/2008   Method used:   Electronically to        [facility]* (retail)       [address] [place], [place]  [address]       Ph: [phone]       Fax: [phone]   RxID:   [id number] CIPROFLOXACIN HCL 500 MG TABS (CIPROFLOXACIN HCL) 1 tab by mouth q 12 hrs x 3 days  #6 x 0   Entered and Authorized by:   Seymour Bars DO   Signed by:   Seymour Bars DO on 03/07/2008   Method used:   Electronically to        [facility]* (retail)       [address] [place], [place]  [address]       Ph: [phone]       Fax: [phone]   RxID:   [id number]  ] Laboratory Results   Urine Tests  Date/Time Recieved: March 07, 2008 11:08 AM  Date/Time Reported: March 07, 2008 11:08 AM   Routine Urinalysis   Color: orange Appearance: Cloudy Glucose: 100   (Normal Range: Negative) Bilirubin: negative   (Normal  Range: Negative) Ketone: trace (5)   (Normal Range: Negative) Spec. Gravity: 1.010   (Normal Range: 1.003-1.035) Blood: large   (Normal Range: Negative) pH: 7.0   (Normal Range: 5.0-8.0) Protein: 100   (Normal Range: Negative) Urobilinogen: 1.0   (Normal Range:  0-1) Nitrite: positive   (Normal Range: Negative) Leukocyte Esterace: large   (Normal Range: Negative)

## 2010-06-19 NOTE — Progress Notes (Signed)
Summary: LEFT WRIST X-RAY RESULT  ---- Converted from flag ---- ---- 01/12/2007 4:58 PM, Seymour Bars DO wrote: Pls call and let pt know her xray is neg for fracture.  She is to wear the brace during the days and take as needed pain meds.  F/U in 2 wks to see if improving. ------------------------------ PATIENT INFORMED AND SAID THAT WRIST IS GETTING STIFFER THAN IT WAS AND THAT WITH THE BRACE SHE CAN'T MOVE IT AS MUCH. PLEASE ADVISE.LM  Phone Note Outgoing Call Call back at [facility] Phone [phone]   Call placed by: Harlene Salts,  January 14, 2007 9:56 AM Call placed to: Patient Reason for Call: Discuss lab or test results Summary of Call: Pls schedule her a visit with Dr Margaretha Sheffield. Initial call taken by: Seymour Bars DO,  January 14, 2007 10:03 AM  Follow-up for Phone Call        PATIENT INFORMED. Follow-up by: Harlene Salts,  January 14, 2007 11:35 AM

## 2010-06-19 NOTE — Progress Notes (Signed)
Summary: ovarian cyst on u/s  Phone Note Outgoing Call Call back at [facility] Phone [phone]   Call placed by: Seymour Bars DO,  May 27, 2007 12:47 PM Summary of Call: called pt about pelvic u/s results and spoke to her.  She has a large R ovarian cyst which is right where she has pain.  Will schedule her to see Dr Penne Lash and fax a copy.  It may need surgical treatment if risk of torsion vs watchful waiting. Initial call taken by: Seymour Bars DO,  May 27, 2007 12:48 PM

## 2010-06-19 NOTE — Letter (Signed)
Summary: [facility]  [facility]   Imported By: Lanelle Bal 02/13/2009 13:19:35  _____________________________________________________________________  External Attachment:    Type:   Image     Comment:   External Document

## 2010-06-19 NOTE — Assessment & Plan Note (Signed)
Summary: ?UTI vs vaginitis   Vital Signs:  Patient Profile:   40 Years Old Female Height:     65 inches Weight:      138 pounds BMI:     23.05 O2 Sat:      99 % Temp:     97.5 degrees F oral Pulse rate:   70 / minute BP sitting:   97 / 59  (right arm) Cuff size:   regular  Vitals Entered By: Harlene Salts (November 23, 2007 1:53 PM)                 Visit Type:  acute PCP:  Seymour Bars DO  Chief Complaint:  itching and pain during urination.  History of Present Illness: 39 yo WF presents for pain with urination that began 2-3 days ago.  She is monogamous with her boyfriend of 1 yr.  More sexually active than previously and has been having more frequent post coital UTIs.  Tried pre and post intercourse voiding.  Using a lubricant.  Also has a white vag discharge and itching.  +REcent antibiotics from dental work.  Not using anything OTC.  Has some L flank pain.  Has some chronic RLQ intermittent pains but no suprapubic pain.  Has some increased voiding frequency but no urgency.  Denies gross hematuria, fevers or GI upset.  Has a Mirena.  No menses since Oct.  Saw [facility] for RLQ pain 3 mos ago and u/s was neg for ovarian cyst.  She is not using condoms or spermicide.      Current Allergies: No known allergies   Past Medical History:    Reviewed history from 07/01/2006 and no changes required:       PAT-- sees Dr Clovis Riley at [facility]       PFO       pap 9-07 in [place]   Family History:    GM- breast cancer at 76    Mother- MVP    Father PTSD, bipolar    M aunt breast cancer at 67  Social History:    Self employed.  Divorced.  4 kids.  (3 live w/ her).  Boyfriend - Barbara Cower.  Exercises regularly.  Nonsmoker.  1-2 ETOH drinks/ day.    Review of Systems      See HPI   Physical Exam  General:     alert, well-developed, well-nourished, and well-hydrated.   Head:     normocephalic and atraumatic.   Eyes:     conjunctiva clear  Nose:     no  nasal discharge.   Mouth:     good dentition and pharynx pink and moist.   Neck:     supple and no masses.   Lungs:     Normal respiratory effort, chest expands symmetrically. Lungs are clear to auscultation, no crackles or wheezes. Heart:     soft 2/6 systolic mumur at the LLSBnormal rate and regular rhythm.   Abdomen:     soft, non-tender, normal bowel sounds, no distention, no masses, and no guarding.  no suprapubic or CVAT Extremities:     no LE edema Skin:     color normal and no rashes.  tanned Cervical Nodes:     No lymphadenopathy noted    Impression & Recommendations:  Problem # 1:  DYSURIA (ICD-788.1) UA inconclusive.  Sent for cx.  If +, will begin prophylactic post-coital abx.  Pre and post sex voiding recommended.  stop use of scented lubricant.   Orders: T-Culture, Urine ([id number])   Problem # 2:  VAGINITIS NOS (ICD-616.10) Post-antibiotic use.  WP pending.  Treat if +.  Abstain from sex until treated appropriately. Orders: T-Wet Prep for Christoper Allegra, Clue Cells ([id number])   Complete Medication List: 1)  Cardizem La 240 Mg Tb24 (Diltiazem hcl coated beads) .... Take 1 tablet by mouth once a day 2)  Antivert 25 Mg Tabs (Meclizine hcl) .Marland Kitchen.. 1-2 tabs by mouth two times a day as needed for vertigo 3)  Rythmol Sr 225 Mg Cp12 (Propafenone hcl) .... Take 1 tablet by mouth two times a day 4)  Klonopin 0.5 Mg Tabs (Clonazepam) .Marland Kitchen.. 1 tab by mouth three times a day as needed anxiety 5)  Diflucan 150 Mg Tabs (Fluconazole) .Marland Kitchen.. 1 tab by mouth x 1, repeat in 3 days if needed   Patient Instructions: 1)  I will call you with wet prep result tomorrow morning and urine culture by Wed or Thursday. 2)  Will find out about BRCA testing.   3)  Call if you want to f/u for RLQ pain in the Fall.  Consider CT or colonscopy for further eval.  Start daily fiber supplement for trial.   ]  Appended Document: ?UTI vs vaginitis        Current Allergies: No known  allergies         Complete Medication List: 1)  Cardizem La 240 Mg Tb24 (Diltiazem hcl coated beads) .... Take 1 tablet by mouth once a day 2)  Antivert 25 Mg Tabs (Meclizine hcl) .Marland Kitchen.. 1-2 tabs by mouth two times a day as needed for vertigo 3)  Rythmol Sr 225 Mg Cp12 (Propafenone hcl) .... Take 1 tablet by mouth two times a day 4)  Klonopin 0.5 Mg Tabs (Clonazepam) .Marland Kitchen.. 1 tab by mouth three times a day as needed anxiety 5)  Diflucan 150 Mg Tabs (Fluconazole) .Marland Kitchen.. 1 tab by mouth x 1, repeat in 3 days if needed    ]  Laboratory Results   Urine Tests  Date/Time Recieved: November 23, 2007 2:32 PM  Date/Time Reported: November 23, 2007 2:32 PM   Routine Urinalysis   Color: yellow Appearance: Clear Glucose: negative   (Normal Range: Negative) Bilirubin: negative   (Normal Range: Negative) Ketone: negative   (Normal Range: Negative) Spec. Gravity: 1.015   (Normal Range: 1.003-1.035) Blood: negative   (Normal Range: Negative) pH: 8.5   (Normal Range: 5.0-8.0) Protein: 100   (Normal Range: Negative) Urobilinogen: 0.2   (Normal Range: 0-1) Nitrite: negative   (Normal Range: Negative) Leukocyte Esterace: trace   (Normal Range: Negative)

## 2010-06-19 NOTE — Letter (Signed)
Summary: [facility]  [facility]   Imported By: Lanelle Bal 09/29/2008 14:33:12  _____________________________________________________________________  External Attachment:    Type:   Image     Comment:   External Document

## 2010-06-19 NOTE — Progress Notes (Signed)
Summary: MEDICATION  Phone Note Call from Patient   Summary of Call: PATIENT WAS HERE LAST FRIDAY AND NOW SHE HAS A SINUS INFECTION. DOES SHE NEED TO COME IN OR CAN YOU CALL HER SOMETHING ELSE INTO [facility] [phone] Initial call taken by: Drinda Butts,  July 07, 2006 1:19 PM  Follow-up for Phone Call        Pt was just treated with PCN, so unlikely to have a new bacterial sinus infection.  Will need OV. Follow-up by: Seymour Bars D.O.,  July 07, 2006 1:58 PM   I MADE HER AN APPT TOMORROW.

## 2010-06-19 NOTE — Progress Notes (Signed)
Summary: ANXIETY  Phone Note Call from Patient Call back at Home Phone [phone] Call back at [phone]   Caller: Patient Call For: Seymour Bars DO Summary of Call: PATIENT HAS BEEN SEEING A THERAPIST FOR HER PERSONEL PROBLEMS AND SHE HAVING ALOT OF  ANXIETY. THERAPIST SUGGESTED THAT HER PRIMARY MD PRESCRIBE SOMETHING LIKE KLONOPIN TO HELP WITH ANXIETY.USES [facility] [place]. Initial call taken by: Harlene Salts,  February 13, 2007 4:57 PM  Follow-up for Phone Call        I either need a copy of her psychology notes or she needs to come in for OV before I can prescribe a new medicine. Follow-up by: Seymour Bars DO,  February 16, 2007 8:07 AM  Additional Follow-up for Phone Call Additional follow up Details #1::        PATIENT INFORMED AND SAID SHE WOULD GET HER THERAPIST TO SEND NOTES TO OUR OFFICE. Additional Follow-up by: Harlene Salts,  February 16, 2007 8:34 AM

## 2010-06-19 NOTE — Letter (Signed)
Summary: PULMONARY NOTES  PULMONARY NOTES   Imported By: Harlene Salts 12/31/2006 08:10:50  _____________________________________________________________________  External Attachment:    Type:   Image     Comment:   External Document

## 2010-06-19 NOTE — Assessment & Plan Note (Signed)
Summary: OM/URI   Vital Signs:  Patient Profile:   40 Years Old Female Height:     65 inches Weight:      135 pounds O2 Sat:      99 % Temp:     98.1 degrees F oral Pulse rate:   75 / minute BP sitting:   110 / 74  (left arm)  Vitals Entered By: Harlene Salts (September 08, 2006 2:53 PM)               Visit Type:  acute PCP:  K Tamryn Popko  Chief Complaint:  body ache, headache, and throat sore.  History of Present Illness: 40 yo WF here for 3 days of Sore Throat, body aches, fatigue, runny nose.  Occasional dry cough.  Does not keep her up at night.  Son sick with a cold.  Taking Aleve, not helping.  Hurts to swallow.  Drinking fluids, but not eating much.  Had Strep Throat in 2007.  No GI upset.  Denies itchy, watery eyes or sneezing.    Prior Medications: CARDIZEM LA 240 MG TB24 (DILTIAZEM HCL COATED BEADS) Take 1 tablet by mouth once a day Current Allergies: No known allergies      Review of Systems      See HPI   Physical Exam  General:     alert, well-developed, well-nourished, and well-hydrated.  in NAD Head:     normocephalic and atraumatic.  sinuses NTTP Eyes:     conjunctiva clear Ears:     scarring of left TM, partly occluded by wax.  R TM with effusion, pus, slight erythema, poor bony landmarks Nose:     no nasal discharge.   Mouth:     good dentition, pharynx pink and moist, pharyngeal erythema, and postnasal drip.  cobblestoned appearance, no tonsils.  no exudates Lungs:     Normal respiratory effort, chest expands symmetrically. Lungs are clear to auscultation, no crackles or wheezes. Heart:     Normal rate and regular rhythm. S1 and S2 normal without gallop, murmur, click, rub or other extra sounds. Abdomen:     soft, non-tender, normal bowel sounds, no distention, no masses, no guarding, no hepatomegaly, and no splenomegaly.   Skin:     warm and dry, color normal and no rashes.   Cervical Nodes:     shotty anterior cervical and submandibular LA,  L>R    Impression & Recommendations:  Problem # 1:  OM, ACUTE SUPPURATIVE NOS (ICD-382.00) Treat with 10 days of Augmentin.  Tylenol or Motrin for pain releif.   Her updated medication list for this problem includes:    Augmentin 875-125 Mg Tabs (Amoxicillin-pot clavulanate) .Marland Kitchen... 1 tab by mouth two times a day x 10 days   Problem # 2:  PHARYNGITIS, VIRAL, ACUTE (ICD-462) Strep test negative.  Supportive care with clear liquids, OTC cold meds OK, throat lozenges.  Call if symptoms not improving in 10 days. Her updated medication list for this problem includes:    Augmentin 875-125 Mg Tabs (Amoxicillin-pot clavulanate) .Marland Kitchen... 1 tab by mouth two times a day x 10 days  Orders: Rapid Strep (02725)   Medications Added to Medication List This Visit: 1)  Augmentin 875-125 Mg Tabs (Amoxicillin-pot clavulanate) .Marland Kitchen.. 1 tab by mouth two times a day x 10 days   Patient Instructions: 1)  Call when your period is ending for Mirena IUD insertion. 2)  Call if symptoms not improved in 10 days. 3)  For ST, plenty of clear  liquids.  Can take Tylenol Extra Strength 500 mg 2 tabs up to 4 x a day.    Laboratory Results  Date/Time Received: September 08, 2006 3:29 PM  Date/Time Reported: September 08, 2006 3:29 PM   Other Tests  Rapid Strep: negative  Kit Test Internal QC: Negative   (Normal Range: Negative)

## 2010-06-19 NOTE — Letter (Signed)
Summary: [facility]  [facility]   Imported By: Esmeralda Links D'jimraou 07/06/2008 14:44:21  _____________________________________________________________________  External Attachment:    Type:   Image     Comment:   External Document

## 2010-06-19 NOTE — Assessment & Plan Note (Signed)
Summary: Strep Throat   Vital Signs:  Patient Profile:   40 Years Old Female Height:     65 inches Weight:      134 pounds BMI:     22.38 O2 Sat:      98 % Temp:     98.0 degrees F oral Pulse rate:   74 / minute BP sitting:   109 / 55  (left arm) Cuff size:   regular  Vitals Entered By: Harlene Salts (June 27, 2006 10:56 AM)               Visit Type:  NOV PCP:  Wynell Balloon  Chief Complaint:  NOV and exposed to strep with a sorethroat.  History of Present Illness: 40 yo WF presents as a new pt for ST that began at 0300 last night.  Her 1 yo son was recently diagnosed with strep throat.  She has HA and slight nausea.  No rininorrhea.  Mild dry cough.  Has not taken anything for it.  Feels tired.  Has subjective fevers. V Prior Medications: Current Allergies: No known allergies   Past Medical History:    PAT-- sees Dr Clovis Riley at [facility]  Past Surgical History:    abdominoplasthy     T- tubes, T&A    ganglion cyst removal   Family History:    GM- breast cancer at 87    Mother- MVP    Father PTSD, bipolar  Social History:    Self employed.  Divorced.  4 kids.  (3 live w/ her).  Not in a relationship.  Exercises regularly.  Nonsmoker.  1-2 ETOH drinks/ day.    Review of Systems      See HPI  General      Complains of chills, fatigue, fever, and loss of appetite.  Resp      Complains of cough.      cough dry  GI      Complains of nausea.      Denies diarrhea and vomiting.   Physical Exam  General:     alert, well-developed, well-nourished, and well-hydrated.   Head:     normocephalic and atraumatic.   Eyes:     conjunctiva clear Ears:     TMs appear normal Nose:     no nasal discharge.   Mouth:     good dentition and pharynx pink and moist.  o/p injected.  no tonsills.  no vesicles or exudate Lungs:     Normal respiratory effort, chest expands symmetrically. Lungs are clear to auscultation, no crackles or wheezes. Heart:     Normal  rate and regular rhythm. S1 and S2 normal without gallop, murmur, click, rub or other extra sounds. Abdomen:     soft, non-tender, no hepatomegaly, and no splenomegaly.   Skin:     color normal.   Cervical Nodes:     enlarged and tender ant cervical and submandibular nodes bilaterally    Impression & Recommendations:  Problem # 1:  STREPTOCOCCAL PHARYNGITIS (ICD-034.0) Treat with 10 days of Pen VK and supportive care.  If not getting better, ie still has fever after 48 hrs, please call (possilbe resistance to PCN). Her updated medication list for this problem includes:    Penicillin V Potassium 500 Mg Tabs (Penicillin v potassium) .Marland Kitchen... 1 tab by mouth three times a day x 10 days   Medications Added to Medication List This Visit: 1)  Cardizem La 240 Mg Tb24 (Diltiazem hcl coated beads) .Marland KitchenMarland KitchenMarland Kitchen  Take 1 tablet by mouth once a day 2)  Loestrin 1.5/30 (21) 1.5-30 Mg-mcg Tabs (Norethindrone acet-ethinyl est) .... Take 1 tablet by mouth once a day 3)  Penicillin V Potassium 500 Mg Tabs (Penicillin v potassium) .Marland Kitchen.. 1 tab by mouth three times a day x 10 days 4)  Ortho Tri-cyclen (28) 0.035 Mg Tabs (Norgestimate-ethinyl estradiol) .Marland Kitchen.. 1 tab by mouth daily   Patient Instructions: 1)  Take all 10 days of Penicillin. 2)  Can use Ibuprofen or Tylenol as needed.  Plenty of clear fluids.  3)  If not getting better by Monday, please call. 4)  Start new pill pack when you would be due for next new pill pack. 5)  Use back up birth control for this month.  Appended Document: Strep Throat      Prior Medications: Current Allergies: No known allergies             Laboratory Results   Urine Tests        Blood Tests    Date/Time Received:  June 27, 2006 12:23 PM  Date/Time Reported:  June 27, 2006 12:23 PM   Other Tests Wet Mount/KOH Rapid Strep: positive  Kit Test Internal QC: Negative   (Normal Range: Negative)

## 2010-06-19 NOTE — Assessment & Plan Note (Signed)
Summary: Vaginitis   Vital Signs:  Patient Profile:   40 Years Old Female Height:     65 inches Weight:      134 pounds Pulse rate:   61 / minute BP sitting:   104 / 66  (left arm) Cuff size:   regular  Vitals Entered By: Harlene Salts (July 17, 2006 9:15 AM)               PCP:  Wynell Balloon  Chief Complaint:  vaginal odor.  History of Present Illness: initially on Lo-estrin.  Now swithced to Ortho-Tricyclen.  Using new shower gel. Now has a vaginal odor. Also on new medicine for seizures and think it might be the urine odor.  No rash.  change in consistancy in vaginal discharge.  Now has a vaginal Odor.  Didn't have before.   Prior Medications: CARDIZEM LA 240 MG TB24 (DILTIAZEM HCL COATED BEADS) Take 1 tablet by mouth once a day LOESTRIN 1.5/30 (21) 1.5-30 MG-MCG TABS (NORETHINDRONE ACET-ETHINYL EST) Take 1 tablet by mouth once a day ORTHO TRI-CYCLEN (28) 0.035 MG TABS (NORGESTIMATE-ETHINYL ESTRADIOL) 1 tab by mouth daily Current Allergies: No known allergies       Physical Exam  General:     Well-developed,well-nourished,in no acute distress; alert,appropriate and cooperative throughout examination Genitalia:     Normal introitus for age, no external lesions, Copious white and clear vaginal discharge, mucosa pink and moist, no vaginal or cervical lesions, no vaginal atrophy, no friaility or hemorrhage, normal uterus size and position, no adnexal masses or tenderness    Impression & Recommendations:  Problem # 1:  VAGINITIS NOS (ICD-616.10) The following medications were removed from the medication list:    Penicillin V Potassium 500 Mg Tabs (Penicillin v potassium) .Marland Kitchen... 1 tab by mouth three times a day x 10 days  Orders: T-Wet Prep for Christoper Allegra, Clue Cells [phone]) T-GC Probe [phone]) Discussed symptomatic relief and treatment options.  The following medications were removed from the medication list:    Penicillin V Potassium 500 Mg Tabs  (Penicillin v potassium) .Marland Kitchen... 1 tab by mouth three times a day x 10 days   Problem # 2:  CONTRACEPTIVE COUNSELING NEC (ICD-V25.09) Also discussed IUD as possible form of birth control if unhappy w/ OCPs. Gave H.O. on Mirena.

## 2010-06-19 NOTE — Miscellaneous (Signed)
Summary: Rehab Report  Rehab Report   Imported By: Harlene Salts 01/28/2008 16:33:29  _____________________________________________________________________  External Attachment:    Type:   Image     Comment:   External Document

## 2010-06-19 NOTE — Assessment & Plan Note (Signed)
Summary: Right AOM/URI   Vital Signs:  Patient Profile:   40 Years Old Female Height:     65 inches Temp:     98.1 degrees F Pulse rate:   68 / minute BP sitting:   108 / 66 (right arm) Cuff size:   regular  Vitals Entered By: Harlene Salts (Sep 29, 2006 1:01 PM)               Visit Type:  acute PCP:  Wynell Balloon  Chief Complaint:  ST/ear pain.  History of Present Illness: 40 yo WF seen 4/21 for R acute suppurative OM, treated with 10 days of Augmentin and concurrent viral URI presents today for similar.  She reports not taking the last 3 days of Augmentin.  Was feeling better.  Felt ok until 2 days ago.  Woke up with ST, hoarsness, fatigue, malaise, slight cough, slight head congestion and R ear pressure.  Took 2 days of remaining Augmentin.  Not much better.  No documented fevers.  No GI upset.  Not a smoker.  No known sick contacts.  Has an ENT specialist.   No tinnitus or hearing loss.  No vertigo.      Current Allergies: No known allergies      Review of Systems      See HPI   Physical Exam  General:     alert, well-developed, well-nourished, and well-hydrated.   Head:     normocephalic and atraumatic.  sinuses are NTTP Eyes:     conjunctiva clear Ears:     R TM with effusion, poor bony landmarks, slight erythema.  pus behind TM.  L TM dull o/w normal Nose:     clear rhinorrhea Mouth:     hoarse voice, o/p injected.  No tonsilar enlargement or exudates Neck:     no masses.   Lungs:     Normal respiratory effort, chest expands symmetrically. Lungs are clear to auscultation, no crackles or wheezes. Heart:     Normal rate and regular rhythm. S1 and S2 normal without gallop, murmur, click, rub or other extra sounds. Abdomen:     soft, non-tender, normal bowel sounds, no hepatomegaly, and no splenomegaly.   Skin:     color normal and no rashes.   Cervical Nodes:     shotty bilateral anterior cervical LA    Impression & Recommendations:  Problem #  1:  ROM (ICD-382.9) 2nd R AOM in 1 month.  Likely did not completely resolved with incomplete antibiotics course.  Treat with 1 gram Rocephin IM injection this time to ensure adequate antibiotics.  May use Tylenol or Ibuprofen for fevers or ear pain. Orders: Rocephin  250mg  ([id number])   Problem # 2:  URI (ICD-465.9) Viral URI.  Supportive care for recurrent sore throat.  If she has another one in the next 8 wks, check CBC, refer back to ENT and look for recurrent environmental causes of reinfection.    Medication Administration  Injection # 1:    Medication: Rocephin  250mg     Diagnosis: URI (ICD-465.9)    Route: IM    Site: RUOQ gluteus&LUQ gluteus    Exp Date: 04/20/2007    Lot #: [id number]    Mfr: NOVAPLUS    Comments: dose given in split doses.    Patient tolerated injection without complications    Given by: Harlene Salts (Sep 29, 2006 1:05 PM)  Orders Added: 1)  Est. Patient Level III [08657] 2)  Rocephin  250mg  [Q4696]

## 2010-06-19 NOTE — Assessment & Plan Note (Signed)
Summary: pelvic pain/ ? UTI   Vital Signs:  Patient Profile:   40 Years Old Female Height:     65 inches Weight:      142.75 pounds BMI:     23.84 BSA:     1.72 Temp:     97.4 degrees F oral Pulse rate:   67 / minute Resp:     18 per minute BP sitting:   104 / 71  (left arm)  Pt. in pain?   yes    Location:   abdomen    Intensity:   3    Type:       dull                  Visit Type:  acute PCP:  Seymour Bars DO  Chief Complaint:  R sided Abdominal Pain since IUD inserted 3 months ago and did a urine test which showed +.  History of Present Illness: 40 yo WF presents with 3 mos of intermittent R sided abdominal pain with cramping and R flank pain.  She has had some dysuria and has had increased frequency.  No fevers.  No vag discharge.  Has not had a period since Mirena was placed.  She has not spoken to the gyn.  Had pelvic pain after internal exam after IUD was placed.  Also has R pelvic pain after intercourse.  Did not have this problem prior to IUD placement and had normal periods.    She denies fevers, nausea or bleeding problems.    Current Allergies: No known allergies   Past Medical History:    Reviewed history from 07/01/2006 and no changes required:       PAT-- sees Dr Clovis Riley at [facility]       PFO       pap 9-07 in [place]   Social History:    Reviewed history from 06/27/2006 and no changes required:       Self employed.  Divorced.  4 kids.  (3 live w/ her).  Not in a relationship.  Exercises regularly.  Nonsmoker.  1-2 ETOH drinks/ day.    Review of Systems      See HPI   Physical Exam  General:     alert, well-developed, well-nourished, and well-hydrated.   Head:     normocephalic and atraumatic.   Eyes:     conjunctiva clear Nose:     no external deformity and no nasal discharge.   Mouth:     good dentition and pharynx pink and moist.   Neck:     supple.   Lungs:     Normal respiratory effort, chest expands  symmetrically. Lungs are clear to auscultation, no crackles or wheezes. Heart:     Normal rate and regular rhythm. S1 and S2 normal without gallop, murmur, click, rub or other extra sounds. Abdomen:     RLQ TTP with guarding.  soft, normal bowel sounds, no distention, no masses, no rigidity, no rebound tenderness, no abdominal hernia, no hepatomegaly, and no splenomegaly.  No CVAT.   Extremities:     no E/C/C Skin:     color normal and no rashes.   Cervical Nodes:     No lymphadenopathy noted Inguinal Nodes:     No significant adenopathy    Impression & Recommendations:  Problem # 1:  PELVIC PAIN, RIGHT (ICD-789.09) R pelvic pain after IUD placment, intermittent but reproducible with bimanual and intercourse.  Obtain transvag/ Pelvic ultrasound to r/o ovarianc cyst or problems from IUD.  OK to use Advill 800 mg three times a day and heat for now. Orders: T-*Unlisted Diagnostic X-ray test/procedure (13086)   Problem # 2:  INFECTION, URINARY TRACT NOS (ICD-599.0) Probable UTI by hx but neg UA.  Sent for cx.  Treat if +. Orders: Urinalysis-dipstick w/o micro ([id number]) T-Culture, Urine ([id number])   Complete Medication List: 1)  Cardizem La 240 Mg Tb24 (Diltiazem hcl coated beads) .... Take 1 tablet by mouth once a day 2)  Antivert 25 Mg Tabs (Meclizine hcl) .Marland Kitchen.. 1-2 tabs by mouth two times a day as needed for vertigo 3)  Rythmol Sr 225 Mg Cp12 (Propafenone hcl) .... Take 1 tablet by mouth two times a day 4)  Klonopin 0.5 Mg Tabs (Clonazepam) .Marland Kitchen.. 1 tab by mouth three times a day as needed anxiety   Patient Instructions: 1)  OK to take Advil 800 mg up to 3 x a day w/ food for abd pain and cramping. 2)  Use heating pad as needed. 3)  Will call you w/ urine culture result by end of the wk. 4)  Pelvic u/s being scheduled at [facility].    ] Laboratory Results   Urine Tests  Date/Time Received: 05/26/2007 @ 0938hrs  Routine Urinalysis   Color: yellow Appearance:  Hazy Glucose: negative   (Normal Range: Negative) Bilirubin: negative   (Normal Range: Negative) Ketone: negative   (Normal Range: Negative) Spec. Gravity: 1.020   (Normal Range: 1.003-1.035) Blood: negative   (Normal Range: Negative) pH: 7.0   (Normal Range: 5.0-8.0) Protein: negative   (Normal Range: Negative) Urobilinogen: 0.2   (Normal Range: 0-1) Nitrite: negative   (Normal Range: Negative) Leukocyte Esterace: negative   (Normal Range: Negative)

## 2010-06-19 NOTE — Letter (Signed)
Summary: [facility]  [facility]   Imported By: Lanelle Bal 01/30/2010 10:32:51  _____________________________________________________________________  External Attachment:    Type:   Image     Comment:   External Document

## 2010-06-19 NOTE — Assessment & Plan Note (Signed)
Summary: dysuria   Vital Signs:  Patient profile:   40 year old female Height:      65 inches Weight:      130 pounds BMI:     21.71 O2 Sat:      100 % on Room air Temp:     97.3 degrees F oral Pulse rate:   65 / minute BP sitting:   121 / 76  (left arm) Cuff size:   regular  Vitals Entered By: Payton Spark CMA (October 20, 2008 4:16 PM)  O2 Flow:  Room air CC: lbp and smelly urine x 3 days. Would like Rx to help her sleep.   Primary Care Provider:  Seymour Bars DO  CC:  lbp and smelly urine x 3 days. Would like Rx to help her sleep.Marland Kitchen  History of Present Illness: 40 yo WF presents for some low back aching.  Her urine smells 'funny' x 3 days.  Her urine is a bright yellow from her vitamins.  She has some discomfort after going to the bathroom.  Was at the beach last wk.  She has a suprapubic aching.  She denies urinary frequency.  No F/C.  Has a little dyspepsia.  No vaginal discharge.  No recent antibiotics.  No new sexual partners.  Has intermittent constipation.  Denies blood in the stool.  She is taking Inositol powder  for immune support x 2 wks.    Current Medications (verified): 1)  Flecainide Acetate 100 Mg Tabs (Flecainide Acetate) .... Take 1 Tablet By Mouth Two Times A Day  Allergies (verified): 1)  ! Demerol  Comments:  Nurse/Medical Assistant: Payton Spark CMA (October 20, 2008 4:17 PM) The patient's medications were reviewed with the patient and were updated in the Medication List.  Past History:  Past Medical History: Reviewed history from 08/17/2008 and no changes required. PAT-- sees Dr Clovis Riley at [facility] PFO/ ASD pap 9-07 in [place]  Social History: Reviewed history from 11/23/2007 and no changes required. Self employed.  Divorced.  4 kids.  (3 live w/ her).  Boyfriend - Barbara Cower.  Exercises regularly.  Nonsmoker.  1-2 ETOH drinks/ day.  Review of Systems      See HPI  Physical Exam  General:  alert, well-developed, well-nourished, and  well-hydrated.   Eyes:  sclera non icteric Nose:  no nasal discharge.   Mouth:  good dentition and pharynx pink and moist.   Neck:  no masses.   Lungs:  normal respiratory effort, normal breath sounds, and no wheezes.   Heart:  normal rate, regular rhythm, and no murmur.   Abdomen:  suprapubic TTP no CVAT, soft, normal bowel sounds, no distention, and no rigidity.   Skin:  color normal.     Impression & Recommendations:  Problem # 1:  DYSURIA (ICD-788.1) UA normal.  Sent for micro and dx.  DDX includes IC, vaginosis.  Advised no caffeine, plenty of water and otc AZO standard x 2-3 days.   The following medications were removed from the medication list:    Amoxicillin-pot Clavulanate 875-125 Mg Tabs (Amoxicillin-pot clavulanate) .Marland Kitchen... 1 tab by mouth q 12 hrs x 10 days  Orders: UA Dipstick w/o Micro (automated)  (81003) T-Urine Microscopic ([id number]) T-Urine Culture (Spectrum Order) ([id number])  Complete Medication List: 1)  Flecainide Acetate 100 Mg Tabs (Flecainide acetate) .... Take 1 tablet by mouth two times a day  Patient Instructions: 1)  Plenty of Water this weekend + use of OTC AZO standard. 2)  Use of [place]. 3)  Urine sent for micro and culture. 4)  I will call you w/ results on Monday.  Laboratory Results   Urine Tests    Routine Urinalysis   Color: yellow Appearance: Cloudy Glucose: negative   (Normal Range: Negative) Bilirubin: negative   (Normal Range: Negative) Ketone: negative   (Normal Range: Negative) Spec. Gravity: 1.015   (Normal Range: 1.003-1.035) Blood: negative   (Normal Range: Negative) pH: 8.5   (Normal Range: 5.0-8.0) Protein: trace   (Normal Range: Negative) Urobilinogen: 0.2   (Normal Range: 0-1) Nitrite: negative   (Normal Range: Negative) Leukocyte Esterace: negative   (Normal Range: Negative)

## 2010-06-19 NOTE — Assessment & Plan Note (Signed)
Summary: KNEE INJURY   Vital Signs:  Patient Profile:   40 Years Old Female CC:      Rt knee pain, injured while skiing Height:     65 inches Weight:      135 pounds BMI:     22.55 O2 Sat:      100 % O2 treatment:    Room Air Temp:     97.3 degrees F oral Pulse rate:   66 / minute Pulse rhythm:   regular Resp:     12 per minute BP sitting:   110 / 67  (left arm) Cuff size:   regular  Pt. in pain?   yes    Intensity:   5  Vitals Entered By: Emilio Math (April 29, 2008 7:02 PM)                   Current Allergies: No known allergies  History of Present Illness Chief Complaint: Rt knee pain, injured while skiing History of Present Illness: Subjective:  Patient complains of twisting right knee while skiing yesterday, now with persistent right knee pain.  She notes that the knee tends to give way.  She denies significant swelling.  She has pain with ambulation.  Current Meds CARDIZEM LA 240 MG TB24 (DILTIAZEM HCL COATED BEADS) Take 1 tablet by mouth once a day IBUPROFEN 800 MG TABS (IBUPROFEN) 1 by mouth three times a day pc TRAMADOL HCL 50 MG  TABS (TRAMADOL HCL) 1 or 2 by mouth hs as needed pain   REVIEW OF SYSTEMS Constitutional Symptoms      Denies fever, chills, night sweats, weight loss, weight gain, and fatigue.  Eyes       Denies change in vision, eye pain, eye discharge, glasses, contact lenses, and eye surgery. Ear/Nose/Throat/Mouth       Denies hearing loss/aids, change in hearing, ear pain, ear discharge, dizziness, frequent runny nose, frequent nose bleeds, sinus problems, sore throat, hoarseness, and tooth pain or bleeding.  Respiratory       Denies dry cough, productive cough, wheezing, shortness of breath, asthma, bronchitis, and emphysema/COPD.  Cardiovascular       Denies murmurs, chest pain, and tires easily with exhertion.    Gastrointestinal       Denies stomach pain, nausea/vomiting, diarrhea, constipation, blood in bowel movements, and  indigestion. Genitourniary       Denies painful urination, kidney stones, and loss of urinary control. Neurological       Denies paralysis, seizures, and fainting/blackouts. Musculoskeletal       Complains of muscle pain, joint pain, decreased range of motion, and muscle weakness.      Denies joint stiffness, redness, swelling, and gout.  Skin       Denies bruising, unusual mles/lumps or sores, and hair/skin or nail changes.  Psych       Denies mood changes, temper/anger issues, anxiety/stress, speech problems, depression, and sleep problems.  Past Medical History:    Reviewed history from 07/01/2006 and no changes required:       PAT-- sees Dr Clovis Riley at [facility]       PFO       pap 9-07 in [place]  Past Surgical History:    Reviewed history from 06/27/2006 and no changes required:       abdominoplasthy        T- tubes, T&A       ganglion cyst removal   Family History:  Reviewed history from 11/23/2007 and no changes required:       GM- breast cancer at 19       Mother- MVP       Father PTSD, bipolar       M aunt breast cancer at 43  Social History:    Reviewed history from 11/23/2007 and no changes required:       Self employed.  Divorced.  4 kids.  (3 live w/ her).  Boyfriend - Barbara Cower.  Exercises regularly.  Nonsmoker.  1-2 ETOH drinks/ day.  Objective:  Appearance:  Patient appears healthy, stated age, and in no acute distress  Right  knee:  No effusion,  erythema, or warmth.  Knee stable, negative drawer test.  McMurray test negative. Decreased range of motion:  has difficulty with full extension and full flexion.  Distinct tenderness medially over MCL. Distal neurovascular intact  X-ray knee:  no fracture    Assessment New Problems: KNEE SPRAIN, RIGHT, MEDIAL COLLATERAL LIGAMENT (ICD-844.1)   Plan New Medications/Changes: TRAMADOL HCL 50 MG  TABS (TRAMADOL HCL) 1 or 2 by mouth hs as needed pain  #20 x 0, 04/29/2008, Donna Christen  MD IBUPROFEN 800 MG TABS (IBUPROFEN) 1 by mouth three times a day pc  #30 x 0, 04/29/2008, Donna Christen MD  New Orders: T-DG Knee 2 Views*R* [73560] New Patient Level II [99202] Ace Wraps 3-5 in/yard  [N2355] Planning Comments:   Ice, elevation, ace wrap, NSAID, begin exercises in about 5 days (instruction sheet given)  Follow Up: Family doctor if not improving 7 to 10 days  The patient and/or caregiver has been counseled thoroughly with regard to medications prescribed including dosage, schedule, interactions, rationale for use, and possible side effects and they verbalize understanding.  Diagnoses and expected course of recovery discussed and will return if not improved as expected or if the condition worsens. Patient and/or caregiver verbalized understanding.    Prescriptions: TRAMADOL HCL 50 MG  TABS (TRAMADOL HCL) 1 or 2 by mouth hs as needed pain  #20 x 0   Entered and Authorized by:   Donna Christen MD   Signed by:   Donna Christen MD on 04/29/2008   Method used:   Print then Give to Patient   RxID:   [id number] IBUPROFEN 800 MG TABS (IBUPROFEN) 1 by mouth three times a day pc  #30 x 0   Entered and Authorized by:   Donna Christen MD   Signed by:   Donna Christen MD on 04/29/2008   Method used:   Print then Give to Patient   RxID:   [id number]    Patient Instructions: 1)  Plan: 2)  Apply ice pack for 30 minutes every 1 to 2 hours today and tomorrow.  Elevate.  Wear Ace wrap until swelling decreases.  Begin exercises in about 5 days as per instruction sheet.  3)    4)  Follow up with family doctor if not improving 7 to 10 days ] ]
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
## 2010-06-19 NOTE — Assessment & Plan Note (Signed)
Summary: UTI   Vital Signs:  Patient Profile:   40 Years Old Female Height:     65 inches Weight:      136 pounds BMI:     22.71 Temp:     96.6 degrees F oral Pulse rate:   66 / minute BP sitting:   106 / 64  (left arm) Cuff size:   regular  Vitals Entered By: Harlene Salts (Oct 01, 2007 10:03 AM)                 Visit Type:  acute PCP:  Seymour Bars DO  Chief Complaint:  odor to urine.  History of Present Illness: 40 yo WF presents for 1 wk of UTI symptoms.  Went to [facility] on Sat and was given Bactrim DS x 5 days with Pyridium for UTI.  Not feeling any better.  Still having dysuria and frequency.  Denies N/V/Flank or abdominal pain.  Denies fevers.  Denies vaginal discharge.      Current Allergies: No known allergies   Past Medical History:    Reviewed history from 07/01/2006 and no changes required:       PAT-- sees Dr Clovis Riley at [facility]       PFO       pap 9-07 in [place]   Social History:    Reviewed history from 06/27/2006 and no changes required:       Self employed.  Divorced.  4 kids.  (3 live w/ her).  Not in a relationship.  Exercises regularly.  Nonsmoker.  1-2 ETOH drinks/ day.    Review of Systems      See HPI   Physical Exam  General:     alert, well-developed, well-nourished, and well-hydrated.   Head:     normocephalic and atraumatic.   Nose:     no external erythema.   Mouth:     good dentition and pharynx pink and moist.   Neck:     supple and no masses.   Lungs:     Normal respiratory effort, chest expands symmetrically. Lungs are clear to auscultation, no crackles or wheezes. Heart:     Normal rate and regular rhythm. S1 and S2 normal without gallop, murmur, click, rub or other extra sounds. Abdomen:     soft.  no suprapubic tenderness.  + vol guarding.  No CVAT.   Skin:     color normal and no rashes.   Cervical Nodes:     No lymphadenopathy noted    Impression & Recommendations:  Problem # 1:   INFECTION, URINARY TRACT NOS (ICD-599.0) UTI - not improved after 5 days Bactrim DS.  UA still +.  Send for culture.  Empircally change to Cipro x 3 days and 2 more days of Pyridium.  Increase water intake.  F/U Cx results on Monday. Her updated medication list for this problem includes:    Cipro 500 Mg Tabs (Ciprofloxacin hcl) .Marland Kitchen... 1 tab by mouth q 12 hrs x 3 days    Pyridium 100 Mg Tabs (Phenazopyridine hcl) .Marland Kitchen... 1 tab by mouth three times a day x 2 days  Orders: UA Dipstick w/o Micro (automated)  (81003) T-Culture, Urine ([id number])   Complete Medication List: 1)  Cardizem La 240 Mg Tb24 (Diltiazem hcl coated beads) .... Take 1 tablet by mouth once a day 2)  Antivert 25 Mg Tabs (Meclizine hcl) .Marland Kitchen.. 1-2 tabs by mouth two times a day as needed for  vertigo 3)  Rythmol Sr 225 Mg Cp12 (Propafenone hcl) .... Take 1 tablet by mouth two times a day 4)  Klonopin 0.5 Mg Tabs (Clonazepam) .Marland Kitchen.. 1 tab by mouth three times a day as needed anxiety 5)  Cipro 500 Mg Tabs (Ciprofloxacin hcl) .Marland Kitchen.. 1 tab by mouth q 12 hrs x 3 days 6)  Pyridium 100 Mg Tabs (Phenazopyridine hcl) .Marland Kitchen.. 1 tab by mouth three times a day x 2 days 7)  Diflucan 150 Mg Tabs (Fluconazole) .Marland Kitchen.. 1 tab by mouth x 1, repeat in 3 days if needed   Patient Instructions: 1)  Start cipro and take every 12 hrs x 3 days. 2)  Use Pyridium x 2 days for comfort. 3)  Drink plenty of water. 4)  Will call you with Culture results on Monday.    Prescriptions: DIFLUCAN 150 MG  TABS (FLUCONAZOLE) 1 tab by mouth x 1, repeat in 3 days if needed  #2 x 0   Entered and Authorized by:   Seymour Bars DO   Signed by:   Seymour Bars DO on 10/01/2007   Method used:   Electronically sent to ...       [facility]*       [address] [place], [place]  [address]       Ph: [phone]       Fax: [phone]   RxID:   [id number] PYRIDIUM 100 MG  TABS (PHENAZOPYRIDINE HCL) 1 tab by mouth three times a day x 2 days  #6 x 0   Entered and  Authorized by:   Seymour Bars DO   Signed by:   Seymour Bars DO on 10/01/2007   Method used:   Electronically sent to ...       [facility]*       [address] [place], [place]  [address]       Ph: [phone]       Fax: [phone]   RxID:   [id number] CIPRO 500 MG  TABS (CIPROFLOXACIN HCL) 1 tab by mouth q 12 hrs x 3 days  #6 x 0   Entered and Authorized by:   Seymour Bars DO   Signed by:   Seymour Bars DO on 10/01/2007   Method used:   Electronically sent to ...       [facility]*       [address] [place], [place]  [address]       Ph: [phone]       Fax: [phone]   RxID:   [id number]  ] Laboratory Results   Urine Tests  Date/Time Recieved: Oct 01, 2007 10:10 AM  Date/Time Reported: Oct 01, 2007 10:10 AM   Routine Urinalysis   Color: orange Appearance: Cloudy Glucose: 100   (Normal Range: Negative) Bilirubin: small   (Normal Range: Negative) Ketone: trace (5)   (Normal Range: Negative) Spec. Gravity: 1.015   (Normal Range: 1.003-1.035) Blood: trace-intact   (Normal Range: Negative) pH: 8.5   (Normal Range: 5.0-8.0) Protein: 100   (Normal Range: Negative) Urobilinogen: 2.0   (Normal Range: 0-1) Nitrite: positive   (Normal Range: Negative) Leukocyte Esterace: large   (Normal Range: Negative)

## 2010-06-19 NOTE — Assessment & Plan Note (Signed)
Summary: asthma/ otits/ palpitations   Vital Signs:  Patient profile:   40 year old female Height:      65 inches Weight:      133 pounds BMI:     22.21 O2 Sat:      100 % on Room air Temp:     97.8 degrees F oral Pulse rate:   76 / minute BP sitting:   111 / 75  (left arm) Cuff size:   regular  Vitals Entered By: Payton Spark CMA (September 04, 2009 1:57 PM)  O2 Flow:  Room air CC: Chest congestion, St, Ear ache, HA x 4 days.    Primary Care Provider:  Seymour Bars DO  CC:  Chest congestion, St, Ear ache, and HA x 4 days. Marland Kitchen  History of Present Illness: 80 yr WF presents with chest congestion, sore throat, HA, and right earache x5 days. C/O chest tightness and SOB but no fevers or sputum production.  Hx of asthma and allergies.  No longer has an inhaler.  She has hx of paroxysmal atrial tachycardia and is due to see the cardiologist back.  She has felt more dizzy and presyncopal lately.  She sees Dr Clovis Riley for cardiology.    She is also having intermittent cramping R pelvic pain which she had a w/u for last year.  She says that it was her ovary last year but it just went away.  She is just starting to have perimenopausal symptoms.  Denies any urinary sympotms, change in bowels or vaginal discharge.    Her potassium was 2.7 when seen in the ER for back pain. This was a couple wks ago and she is due to have her level rechecked.    She was seen by neurology for her back pain and received an epidural injection. She denies back pain today.      Current Medications (verified): 1)  None  Allergies (verified): 1)  ! Demerol  Past History:  Past Medical History: Reviewed history from 08/17/2008 and no changes required. PAT-- sees Dr Clovis Riley at [facility] PFO/ ASD pap 9-07 in [place]  Past Surgical History: Reviewed history from 12/23/2008 and no changes required. abdominoplasthy  T- tubes, T&A ganglion cyst removal from left wrist  Family History: Reviewed  history from 11/23/2007 and no changes required. GM- breast cancer at 54 Mother- MVP Father PTSD, bipolar M aunt breast cancer at 69  Social History: Reviewed history from 11/23/2007 and no changes required. Self employed.  Divorced.  4 kids.  (3 live w/ her).  Boyfriend - Barbara Cower.  Exercises regularly.  Nonsmoker.  1-2 ETOH drinks/ day.  Review of Systems  The patient denies fever, vision loss, decreased hearing, and hoarseness.   General:  Denies fever and weakness. Eyes:  Denies blurring, double vision, and itching. ENT:  Complains of earache, postnasal drainage, and sore throat; denies decreased hearing, difficulty swallowing, ear discharge, hoarseness, nasal congestion, and ringing in ears. CV:  Complains of chest pain or discomfort, near fainting, palpitations, and shortness of breath with exertion. Resp:  Complains of chest discomfort, chest pain with inspiration, cough, and shortness of breath. GI:  Complains of abdominal pain; denies change in bowel habits, constipation, diarrhea, nausea, and vomiting.  Physical Exam  General:  alert, well-developed, well-nourished, and well-hydrated.   Head:  normocephalic, atraumatic, and no abnormalities observed.   Eyes:  Conjunctiva clear. pupils reactive to light.   Ears:  both TMs have chronic scaring but she has loss of bony  landmarks, poor light reflection and purulence w/o injection behind both TMs. Nose:  no external deformity, no external erythema, and no mucosal edema.   Mouth:  good dentition and pharynx pink and moist.   Neck:  no masses.   Chest Wall:  no deformities and no tenderness.   Lungs:  normal respiratory effort, no accessory muscle use, normal breath sounds, and no wheezes.   Heart:  normal rate, regular rhythm, and no murmur.   Abdomen:  R pellvis with voluntary guarding.  NABS.  soft.  ND.  NABS. Extremities:  No BLE edema. Skin:  turgor normal, color normal, and no rashes.   Cervical Nodes:  no anterior cervical  adenopathy and no posterior cervical adenopathy.   Psych:  normally interactive, good eye contact, not anxious appearing, and not depressed appearing.     Impression & Recommendations:  Problem # 1:  ALLERGIC ASTHMA (ICD-493.00)  Start on Zyrtec for allergies and ProAir inhaler for chest congestion and cough, use inhaler for 10 days.  Call if dyspnea not improved.   Orders: T-CBC w/Diff ([id number])  Her updated medication list for this problem includes:    Proair Hfa 108 (90 Base) Mcg/act Aers (Albuterol sulfate) .Marland Kitchen... 2 puffs q 4 hrs prn  Problem # 2:  PALPITATIONS (ICD-785.1) Labs today to r/o organic cause for palptiations esp with hx of low K+.  Will get her back in with Dr Clovis Riley.  It appears that she was due for an ablation last year but never proceeded with it.  HR/ rhythm are normal on exam. Labs today. Will call results tomorrow.   Orders: T-Basic Metabolic Panel [id number]) T-TSH ([id number])  Problem # 3:  OTITIS MEDIA, BILATERAL (ICD-382.9) Start amoxicillin x10 days for bilateral otitis media.   Her updated medication list for this problem includes:    Amoxicillin 875 Mg Tabs (Amoxicillin) .Marland Kitchen... 1 tab by mouth q 12 hrs x 10 days  Problem # 4:  HYPOKALEMIA, HX OF (ICD-V12.2) Reviewed ER report. Potassium 2.7 on 08/20/2009.  Labs today. Will call results tomorrow.   Problem # 5:  PAIN IN THORACIC SPINE (ICD-724.1) Reviewed ER report. Pt. denies pain today. Will monitor.   Complete Medication List: 1)  Amoxicillin 875 Mg Tabs (Amoxicillin) .Marland Kitchen.. 1 tab by mouth q 12 hrs x 10 days 2)  Proair Hfa 108 (90 Base) Mcg/act Aers (Albuterol sulfate) .... 2 puffs q 4 hrs prn 3)  Zyrtec Allergy 10 Mg Tabs (Cetirizine hcl) .Marland Kitchen.. 1 tab by mouth qhs  Patient Instructions: 1)  Start on Zyrtec each night for allergies. 2)  Use Inhaler 2 puffs 4 x a day for the next 10 days. 3)  Take 10 days of Amoxicillin for ear infection (bilateral). 4)  Labs today. 5)  Will call you w/  results tomorrow. 6)  Will get you back in with Dr Clovis Riley ASAP.   Prescriptions: PROAIR HFA 108 (90 BASE) MCG/ACT AERS (ALBUTEROL SULFATE) 2 puffs q 4 hrs prn  #1 x 0   Entered and Authorized by:   Seymour Bars DO   Signed by:   Seymour Bars DO on 09/04/2009   Method used:   Electronically to        [facility]* (retail)       [address] [place], [place]  [address]       Ph: [phone]       Fax: [phone]   RxID:   [id number] AMOXICILLIN 875 MG TABS (  AMOXICILLIN) 1 tab by mouth q 12 hrs x 10 days  #20 x 0   Entered and Authorized by:   Seymour Bars DO   Signed by:   Seymour Bars DO on 09/04/2009   Method used:   Electronically to        [facility]* (retail)       [address] [place], [place]  [address]       Ph: [phone]       Fax: [phone]   RxID:   [id number]

## 2010-06-19 NOTE — Assessment & Plan Note (Signed)
Summary: L wrist pain   Vital Signs:  Patient Profile:   40 Years Old Female Height:     65 inches Weight:      139 pounds Pulse rate:   72 / minute BP sitting:   124 / 69  (left arm) Cuff size:   regular  Vitals Entered By: Harlene Salts (January 12, 2007 1:35 PM)               Visit Type:  acute PCP:  Seymour Bars DO  Chief Complaint:  left wrist pain.  History of Present Illness: 40 yo WF seen for 10 days of L ventral wrist pain, worse with supination and wrist extension.  Fell on outstretched hand just before it started hurting.  No bruise or swelling.  Wearing OTC brace that is not helping.  Has been playing softball and water volleyball.  Denies hand tingling or numbness.  Aches esp at night.  Saw Dr Butler Denmark 12/06 for removal of a L wrist ganglion cyst.  Denies taking anything OTC.    She was told that she had an extra extensor muscle.  Denies radiation of pain and most of it is right over the carpal bones.  Current Allergies: No known allergies      Review of Systems      See HPI   Physical Exam  General:     alert, well-developed, well-nourished, and well-hydrated.   Head:     normocephalic and atraumatic.   Msk:     point tenderness over the L wrist carpal bones, worse with supination and extension.  Normal finger ROM.  No pain over the metacarpals.  No bruising, redness or swelling. Pulses:     2+ radial and ulnar pulses Extremities:     no edema clubbing or cyanosis Neurologic:     sensation intact to light touch.   Skin:     color normal.      Impression & Recommendations:  Problem # 1:  WRIST PAIN, LEFT (ICD-719.43) Given fall on outstretched hand and carpal pain, esp at night, r/o fracture with xray.  Placed in a L wrist splint for comfort.  Rx Motrin to take for 10 days.  F/U in 2 wks.  If not better or +Fx--> refer back to Dr Butler Denmark. Orders: Splint Wrist or Ankle ([id number]) T-Wrist x-ray, 2 views ([id number])   Complete Medication List: 1)   Cardizem La 240 Mg Tb24 (Diltiazem hcl coated beads) .... Take 1 tablet by mouth once a day 2)  Antivert 25 Mg Tabs (Meclizine hcl) .Marland Kitchen.. 1-2 tabs by mouth two times a day as needed for vertigo 3)  Rythmol Sr 225 Mg Cp12 (Propafenone hcl) .... Take 1 tablet by mouth two times a day 4)  Motrin 800 Mg  .Marland Kitchen.. 1 tab by mouth three times a day with food x 10 days     Prescriptions: MOTRIN 800 MG 1 tab by mouth three times a day with food x 10 days  #30 x 0   Entered and Authorized by:   Seymour Bars DO   Signed by:   Seymour Bars DO on 01/12/2007   Method used:   Print then Give to Patient   RxID:   [id number]

## 2010-06-19 NOTE — Progress Notes (Signed)
Summary: LAB RESULTS  Phone Note Call from Patient Call back at Home Phone [phone]   Reason for Call: Lab or Test Results Summary of Call: PAT CALLED TO REQUEST LAB RESULTS. Initial call taken by: Drinda Butts,  July 22, 2006 3:19 PM  Follow-up for Phone Call        Reassured patient that all test were normal. Patient said she still had the discharge and was going to stop her BCP to see if the problem will stop. Patient also is going to call her insurance company to see if they cover mirana IUD, then she will call us and let us know so we can order it. Patient verbalized understanding of plan. Follow-up by: Harlene Salts,  July 22, 2006 4:57 PM
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
## 2010-06-19 NOTE — Miscellaneous (Signed)
Summary: Rehab Report  Rehab Report   Imported By: Harlene Salts 02/02/2008 09:53:25  _____________________________________________________________________  External Attachment:    Type:   Image     Comment:   External Document

## 2010-06-19 NOTE — Progress Notes (Signed)
Summary: vertigo  Phone Note Outgoing Call Call back at [facility] Phone [phone]   Call placed by: Seymour Bars D.O.,  September 10, 2006 9:36 AM Summary of Call: Pt having Vertigo with acute OM.  On Day 3 of Augmentin.  No fevers.  'zapping', non-painful feeling in head last night and today.  Has N/V from dizziness.  Will start Antivert, call into [facility] [place] and Sudafed for congestion.  If not hetter by tomorrow, please call.  Has seen PENTA in past. Initial call taken by: Seymour Bars D.O.,  September 10, 2006 9:37 AM  Follow-up for Phone Call        Rx for antivert called to [facility] per MD order. Follow-up by: Harlene Salts,  September 10, 2006 9:55 AM  New/Updated Medications: ANTIVERT 25 MG TABS (MECLIZINE HCL) 1-2 tabs by mouth two times a day as needed for vertigo  New/Updated Medications: ANTIVERT 25 MG TABS (MECLIZINE HCL) 1-2 tabs by mouth two times a day as needed for vertigo  Prescriptions: ANTIVERT 25 MG TABS (MECLIZINE HCL) 1-2 tabs by mouth two times a day as needed for vertigo  #24 x 0   Entered and Authorized by:   Seymour Bars D.O.   Signed by:   Harlene Salts on 09/10/2006   Method used:   Telephoned to ...       [facility]       [address] [place], [place]  [address]  [place]       Ph: [phone]       Fax: [phone]   RxID:   [id number]

## 2010-06-19 NOTE — Progress Notes (Signed)
Summary: IUD  Phone Note Call from Patient Call back at [facility] Phone [phone]   Summary of Call: PAM'S INSURANCE WILL COVER THE MARINA IUD. SHE WILL BE RESPONSIBLE FOR HER COPAY ONLY. PLEASE ORDER. Initial call taken by: Drinda Butts,  July 23, 2006 1:30 PM  Follow-up for Phone Call        Please order from company.  Can set up with Dr. Cathey Endow.  Follow-up by: Nani Gasser MD,  July 23, 2006 1:44 PM  Additional Follow-up for Phone Call Additional follow up Details #1::        Called and left message on machine that mirena had been orderd and will be in on Friday and she should call and scheduled OV with Dr B for IUD placement Additional Follow-up by: Harlene Salts,  July 23, 2006 3:37 PM

## 2010-06-19 NOTE — Progress Notes (Signed)
Summary: Neuro referral   Phone Note Call from Patient   Caller: Patient Summary of Call: Pt would like to be referred to Neuro for burning on shoulders. Pt states she has had this problem for many years and has done some research  on it. Pt thinks neuro would be her best option. Pt would like a referral to neuro of your choice. Please advise.  Initial call taken by: Payton Spark CMA,  December 23, 2008 12:12 PM  Follow-up for Phone Call        schedule OV for exam first.  cannot refer w/o being seen for this problem. Follow-up by: Seymour Bars DO,  December 23, 2008 12:17 PM

## 2010-06-19 NOTE — Progress Notes (Signed)
Summary: treat UTI  Phone Note Outgoing Call Call back at [facility] Phone [phone]   Summary of Call: Selena Batten - pls call and let pt know that her urine cx did grow out bacterial.  Will treat with 7 days of Vantin. Initial call taken by: Seymour Bars DO,  May 28, 2007 5:04 PM  Follow-up for Phone Call        Called pt LM to call office back for UA culture results and to get pharmacy name so we could call meds to Follow-up by: Kathlene November,  May 28, 2007 5:27 PM  Additional Follow-up for Phone Call Additional follow up Details #1::        LM on VM for pt to call office back. KJ LPN @ 0:98JX- Additional Follow-up by: Kathlene November,  June 01, 2007 8:22 AM    Additional Follow-up for Phone Call Additional follow up Details #2::    Spoke with pt and gave UA culture results. Instructed her to start the Vantin for 7 days. Pt agrees. Med called to [facility] [place] Follow-up by: Kathlene November,  June 02, 2007 10:16 AM  New/Updated Medications: VANTIN 200 MG  TABS (CEFPODOXIME PROXETIL) 1 tab by mouth two times a day x 7 days   Prescriptions: VANTIN 200 MG  TABS (CEFPODOXIME PROXETIL) 1 tab by mouth two times a day x 7 days  #14 x 0   Entered and Authorized by:   Seymour Bars DO   Signed by:   Kathlene November on 05/28/2007   Method used:   Print then Give to Patient   RxID:   [id number]

## 2010-06-21 NOTE — Letter (Signed)
Summary: [facility]  [facility]   Imported By: Lanelle Bal 06/08/2010 14:20:05  _____________________________________________________________________  External Attachment:    Type:   Image     Comment:   External Document

## 2010-07-11 NOTE — Letter (Signed)
Summary: [facility]  [facility]   Imported By: Lanelle Bal 07/03/2010 12:31:24  _____________________________________________________________________  External Attachment:    Type:   Image     Comment:   External Document

## 2010-10-02 NOTE — Assessment & Plan Note (Signed)
[name], [name]               ACCOUNT NO.:  [id number]   MEDICAL RECORD NO.:  [id number]          PATIENT TYPE:  POB   LOCATION:  [facility] at [place]         FACILITY[facility]  [facility]   PHYSICIAN:  Maylon Cos, CNM    DATE OF BIRTH:  1971-05-02   DATE OF SERVICE:                                  CLINIC NOTE   The patient presented today, March 24, 2007, for an annual exam and  IUD insertion after she was referred from her primary care [name], who  is [name] [facility] in [place].  The patient is a 40-year-  old Caucasian female who has a negative medical history.  She is a  gravida 4, para 4.  She has had four vaginal deliveries, the last of  which was in February of 2004.  Her last Pap smear was done in August  2007.  She does have a history of having an abnormal Pap smear at the  age of 89, for which cryosurgery was performed, and has had all normal  since then.   Her surgical history is significant for breast implants in June 2008.  She had hand surgery in December 2006 and abdominoplasty in May 2001.  The first day of her last menstrual period started yesterday.  She  experiences a light flow that lasts approximately 3-4 days.   FAMILY HISTORY:  Significant for heart disease in her mother, high blood  pressure in mom, dad and grandparents, and cancer.  Her grandmother was  diagnosed with breast cancer at the age of 80 and ended up passing away  as a result of metastatic cancer that originated from the breast cancer  at the age of 29.  She has not had a mammogram as of yet.   She presents today for a GYN physical and Pap smear and IUD insertion.  Questions were answered concerning her choice of Mirena versus the  PeriGuard and a decision was made to proceed with insertion of the  Mirena IUD.  Consent was signed.   Her exam today was benign.  She is a very pleasant white female in no  apparent distress, who appears her stated age.  HEENT:  Grossly intact.   Good dentition.  No enlargement of her thyroid.  LUNGS:  Clear bilaterally A&P.  No CVA tenderness.  HEART:  Regular rate and rhythm without murmur.  BREASTS:  Positive for bilateral breast implants; however, no masses  were felt throughout the breast tissue or in the axillae.  They are  nontender.  Her nipples are erect without discharge.  ABDOMEN:  No lesions.  She does have a low transverse incision, a scar  incision from her abdominoplasty, which is well-healed.  She has no  hepatosplenomegaly.  Her abdomen is nontender.  PELVIC:  She has a shaved perineum.  External genitalia is without  lesions.  Mucous membranes are pink.  On speculum exam her uterus is  retroverted and there is a scant amount of menstrual blood present in  the vault.  Her cervix is easily visualized and blood was cleared using  a Fox swab and specimens for thin-prep Pap smear were obtained.  Her  cervix was cleaned with  Betadine and the tenaculum was placed at  approximately 2 o'clock.  Her uterus sounded to 7 cm.  Guide on Mirena  was placed the same and advanced without difficulty.  The strings were  trimmed to approximately 2 cm beyond the external cervical os and the  tenaculum was removed.  The cervix was found to be hemostatic.  A  bimanual was performed after the insertion of the IUD.  The uterus is  nontender.  Her ovaries are palpable without masses and are also  nontender.  Femoral pulses are strong and equal.  Lower extremities are without  edema and the pulses in her lower extremities are within normal limits.   PROBLEM LIST:  A 40 year old female who presents today for a gynecologic  wellness exam and Papanicolaou smear.  It is recommended that she have a  mammogram secondary to her family history of her grandmother being  diagnosed and subsequently died at a very young age of breast cancer,  and that was scheduled.  For contraception today she had a Mirena  intrauterine device placed without  complications.  The patient did  experience extreme amount of cramping within 10-15 minutes after the  intrauterine device was inserted and was given 600 mg of ibuprofen and  also given a prescription to use around the clock for the next 24 hours.  The patient is to return in 5-6 weeks for an IUD check and call the  office with any increase in pain that is not relieved by her ibuprofen.           ______________________________  Maylon Cos, CNM     SS/MEDQ  D:  03/24/2007  T:  03/25/2007  Job:  [id number]

## 2010-10-02 NOTE — Op Note (Signed)
[name], [name]               ACCOUNT NO.:  [id number]   MEDICAL RECORD NO.:  [id number]          PATIENT TYPE:  AMB   LOCATION:  [facility]                           FACILITY:  [facility]   PHYSICIAN:  Sherron Monday, MD        DATE OF BIRTH:  03-20-1971   DATE OF PROCEDURE:  01/14/2008  DATE OF DISCHARGE:                               OPERATIVE REPORT   PREOPERATIVE DIAGNOSIS:  Undesired fertility.   POSTOPERATIVE DIAGNOSIS:  Undesired fertility.   PROCEDURE:  Bilateral tubal ligation by fulguration.   SURGEON:  Sherron Monday, MD   ANESTHESIA:  General with 6.5 mL of 0.25% Marcaine local anesthesia.   FINDINGS:  Normal uterus, tubes, and ovaries.   SPECIMENS:  None.   ESTIMATED BLOOD LOSS:  Minimal.   IV FLUIDS:  1100 mL   URINE OUTPUT:  I&O cath prior to the procedure.   COMPLICATIONS:  None.   DISPOSITION:  Stable to PACU following the procedure.   DESCRIPTION OF PROCEDURE:  After informed consent was reviewed with the  patient including risks, benefits, and alternatives of the surgical  procedure as well as risk of failure, to which the patient voiced  understanding, we transferred her to the OR, she was placed on the  operating table in supine position.  She was then placed in the Yellofin  stirrups to adjust for her comfort.  General anesthesia was induced and  found to be adequate.  She was then prepped and draped in the normal  sterile fashion.  Her bladder was sterilely drained.  Using an open-  sided speculum, a uterine manipulator was placed, the gloves were  changed and attention was turned to the abdominal portion of the case.  An approximately, a centimeter infraumbilical incision was made.  Using  the Veress needle, I was unable to illustrate entry into the peritoneum,  so decision was made to proceed with open laparoscopy secondary to her  history of an abdominoplasty.  The fascia was grasped with Kocher  clamps, elevated, and incised in the midline.  The posterior  sheath of  the fascia was grasped, elevated, and incised, and using direct entry  into the peritoneal cavity, it was confirmed.  Using towel clips,  the  11/12 XL trocar was made airtight.  Using the operative scope, the  pelvic survey was performed revealing normal uterus, tubes, and ovaries.  The tubes were confirmed by following them out to the fimbriated end.  Using the triple burn technique bilaterally and the Kleppingers, the  tubal ligation was performed by fulguration.  The patient tolerated the  procedure well.  The air was evacuated from the peritoneum.  The fascia was closed with 0 Vicryl.  The skin was closed with 3-0  Vicryl and the skin was doubly closed with Dermabond.  The patient  tolerated the procedure well.  Sponge, lap, and needle counts were  correct x2 at the end of the procedure.        Sherron Monday, MD  Electronically Signed     JB/MEDQ  D:  01/14/2008  T:  01/15/2008  Job:  [id number] 

## 2010-10-02 NOTE — H&P (Signed)
NAMEELDRED, [name]               ACCOUNT NO.:  [id number]   MEDICAL RECORD NO.:  [id number]          PATIENT TYPE:  AMB   LOCATION:                                [facility]ILITY[facility]  [facility]   PHYSICIAN:  Sherron Monday, MD        DATE OF BIRTH:  Feb 13, 1971   DATE OF ADMISSION:  01/14/2008  DATE OF DISCHARGE:                              HISTORY & PHYSICAL   ADMITTING DIAGNOSIS:  Undesired fertility.   PROCEDURE PLANNED:  Bilateral tubal ligation by fulguration.   HISTORY OF PRESENT ILLNESS:  A 40 year old G55, P4-0-0-4 with undesired  fertility has been using Mirena for birth control however desires  removal of this and a bilateral tubal ligation for permanent  contraception.   PAST MEDICAL HISTORY:  Significant for bipolar as well as atrial  tachycardia.   PAST SURGICAL HISTORY:  Significant for abdominoplasty.   PAST OB/GYN HISTORY:  She is a G4, P4-0-0-4 with four term deliveries.  Mom said she had preterm labor, however, they were all delivered at term  and were able to go home with their mother.  History of an abnormal Pap  smear and they have been normal since.  Her last was December 16, 2007.  She  has a history of herpes simplex with annual outbreaks.  Her last  menstrual period was October 2008 and she has had none since.  She uses  the Mirena for birth control.  However, she would like bilateral tubal  ligation.  She has some dyspareunia which is better with position  change.  She states that she has occasional intermenstrual bleeding  which she describes as spotty since using the Mirena.  She is bothered  by this.  She is sexually active with a single female partner.   MEDICATIONS:  Include Cardizem and Valtrex.   ALLERGIES:  Are to DEMEROL which causes her to hallucinate.   SOCIAL HISTORY:  She denies tobacco use and she is an occasional alcohol  user.  No other drug use.  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   FAMILY HISTORY:  Significant for breast cancer in  grandmother, lung  cancer in grandfather and another grandfather with esophageal cancer.  Her mother has mitral valve prolapse as does a sister.  She has  hypertension and hypercholesterolemia in the mother.  No diabetes or  coronary artery disease in her family.   PHYSICAL EXAM:  VITAL SIGNS:  She is 5 feet 5 inches and weighs 138-1/2  pounds.  Afebrile.  Vital signs stable.  GENERAL:  No apparent distress.  CARDIOVASCULAR:  Regular rate and rhythm.  LUNGS:  Clear to auscultation bilaterally.  ABDOMEN:  Soft, nontender, nondistended with good bowel sounds.  EXTREMITIES:  Symmetric and nontender.  NECK:  No lymphadenopathy.  Thyroid within normal limits.  HEENT:  Mucous membranes were moist.  BREAST:  Deferred.  BACK:  No costovertebral angle tenderness.  PELVIC:  Normal external female genitalia, normal Bartholin's, urethral  and Skene's glands with good support.  Cervix and vagina without  lesions.  No cervical motion tenderness.  Normal  uterus.  Adnexa no  masses and nontender.   ASSESSMENT AND PLAN:  Discussed with the patient at length birth control  including continuation of the Mirena or Essure tubal ligation versus  laparoscopic tubal ligation.  We discussed the permanence of the  procedure.  We also discussed the risks, benefits and alternatives  including bleeding, infection, damage to surrounding organs and risk of  failure of less than 1 in 200.  The patient voices understanding to this  and wishes to proceed.  She is scheduled to have a tubal ligation  performed on January 14, 2008.      Sherron Monday, MD  Electronically Signed     JB/MEDQ  D:  01/13/2008  T:  01/13/2008  Job:  [id number]

## 2010-10-05 NOTE — Discharge Summary (Signed)
[facility]  Patient:    [name], [name]                      MRN: [id number] Adm. Date:  [id number] Disc. Date: 12/01/00 Attending:  Michaele Offer                           Discharge Summary  ADMISSION DIAGNOSIS:          Intrauterine pregnancy at 37 weeks.  DISCHARGE DIAGNOSIS:          Intrauterine pregnancy at 37 weeks.  PROCEDURES:                   Spontaneous vaginal delivery.  COMPLICATIONS:                None.  CONSULTATIONS:                None.  HISTORY OF PRESENT ILLNESS:   This is a 40 year old, white female, gravida 3, para 2-0-0-2, with an EGA of 37+ weeks by a 9-week ultrasound with a due date of December 16, 2000, who presents with a complaint of regular contractions without bleeding or rupture of membranes and good fetal movement.  The vaginal exam in maternity admissions was 450 and -2 and the patient was admitted. Prenatal care complicated by asthma treated with p.r.n. albuterol inhaler and Claritin for allergies.  PRENATAL LABORATORY DATA:     Blood type is O- with a negative antibiotic screen.  RPR nonreactive.  Rubella equivocal.  Hepatitis B surface antigen negative.  HIV negative.  Gonorrhea and chlamydia negative.  Triple screen normal.  The one-hour Glucola was 134.  Group B streptococcus was negative.  PAST OBSTETRICAL HISTORY:     In April of 1991, vaginal delivery at 42 weeks, 7 pounds 3 ounces, without complications.  In July of 1994, vaginal delivery at 39 weeks, 7 pounds 8 ounces, and she had a protracted active phase.  PAST MEDICAL HISTORY:         Asthma.  PAST SURGICAL HISTORY:        Abdominoplasty.  ALLERGIES:                    DEMEROL.  MEDICATIONS:                  1. Albuterol MDI p.r.n.                               2. Claritin 10 mg q.d.  PHYSICAL EXAMINATION:         She as afebrile with stable vital signs.  Fetal heart tracing reactive with contractions every three to four  minutes.  ABDOMEN:                      Gravid and nontender with a fundal height of 37 cm and an estimated fetal weight of 7 pounds.  PELVIC:                       The vaginal exam on my first exam was 450, -1 to -2 with a vertex presentation, and an adequate pelvis.  Rupture of membranes revealed clear amniotic fluid.  HOSPITAL COURSE:              The patient was admitted and was found  to be in latent labor.  Artificial rupture of membranes was performed for labor augmentation.  She then required Pitocin to enter active labor, but once in active labor progressed fairly well, reached complete, and pushed well.  On the afternoon of November 29, 2000, she had a vaginal delivery of a viable female infant with Apgars of 8 and 9 and weight of 6 pounds 8 ounces over an intact perineum and a loose nuchal cord x 1 was reduced.  The placenta delivered spontaneously and was intact.  The perineum was intact and the estimated blood loss was less than 500 cc.  Postpartum, she did very well, breast-fed her baby without complications, and remained afebrile.  On the morning of postpartum #2, she was felt to be stable enough for discharge home.  CONDITION ON DISCHARGE:       Stable.  DISPOSITION:                  Discharged to home.  She was given our discharge pamphlet.  DIET:                         Her diet is a regular diet.  ACTIVITY:                     Pelvic rest.  FOLLOW-UP:                    In four to six weeks.  DISCHARGE MEDICATIONS:        Over-the-counter Motrin or Aleve p.r.n. and Percocet p.r.n. for pain. DD:  12/01/00 TD:  12/01/00 Job: [id number]

## 2010-10-05 NOTE — Discharge Summary (Signed)
[name], [name]                         ACCOUNT NO.:  [id number]   MEDICAL RECORD NO.:  [id number]                   PATIENT TYPE:  INP   LOCATION:  9107                                 FACILITY:  [facility]   PHYSICIAN:  Huel Cote, M.D.              DATE OF BIRTH:  02-Oct-1970   DATE OF ADMISSION:  06/22/2003  DATE OF DISCHARGE:                                 DISCHARGE SUMMARY   DISCHARGE DIAGNOSES:  1. Preterm pregnancy at 36 weeks, delivered.  2. Status post spontaneous vaginal delivery.   DISCHARGE MEDICATIONS:  1. Motrin 600 mg p.o. q.6h.  2. Percocet one to two tablets p.o. q.4h. p.r.n.   DISCHARGE FOLLOW-UP:  The patient is to follow up in the office in 6 weeks  for her routine postpartum exam.   HOSPITAL COURSE:  The patient is a 40 year old G4 P3-0-0-3 who is admitted  at 61 and one-seventh weeks gestation after spontaneous rupture of membranes  occurred at home.  Prenatal course had otherwise been uncomplicated.  She  did have a history of preterm contractions with her other pregnancies.  Prenatal labs are as follows:  O positive, antibody negative, RPR  nonreactive, rubella immune, hepatitis B surface antigen negative, HIV  declined, GC negative, chlamydia negative, group B strep unknown, triple  screen normal.  Past OB history:  In 1991 she had a normal spontaneous  vaginal delivery of a 7-pound 3-ounce infant.  In 1994 she had a vaginal  delivery of a 7-pound 8-ounce infant.  In 2002 she had a vaginal delivery of  a 6-pound 8-ounce infant.  Past GYN history:  She did have a history of  abnormal Pap smears; however, these resolved without treatment.  Past  surgical history:  An abdominoplasty and a tonsillectomy.  Past medical  history:  She has asthma; however, does not require current medications, and  a history of SVT and was placed on Cardizem at [redacted] weeks gestation for  palpitations.  She is allergic to DEMEROL.  Medications include the  Cardizem.  On  admission she was afebrile with stable vital signs.  Fetal  heart rate was reassuring.  Contractions were every 2-3 minutes.  Cardiac  exam was regular rate and rhythm, lungs were clear, abdomen was soft and  nontender.  Cervix was completely effaced, 4 cm, and -2 station, and the  morning after admission after being placed on Pitocin the patient shortly  thereafter reached complete dilation and pushed well with a normal  spontaneous vaginal delivery of a vigorous female infant.  Apgars were 8 and  9, weight was 6 pounds 1 ounce.  She had no lacerations which required  repair and EBLwas less than 500 mL.  On postpartum day #2 she was doing very  well.  Her back was sore and she had significant cramping.  Otherwise, was  without complaint.  She had normal lochia and was ambulating well.  She was  afebrile with stable vital signs, fundus was firm, and she was felt stable  for discharge home.  She was therefore discharged home with medications and  follow-up as previously stated.                                               Huel Cote, M.D.    KR/MEDQ  D:  06/25/2003  T:  06/25/2003  Job:  [id number]
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
## 2010-10-09 ENCOUNTER — Inpatient Hospital Stay (INDEPENDENT_AMBULATORY_CARE_PROVIDER_SITE_OTHER)
Admission: RE | Admit: 2010-10-09 | Discharge: 2010-10-09 | Disposition: A | Discharge status: Home / Self Care | Payer: BC Managed Care – PPO | Source: Ambulatory Visit | Attending: Emergency Medicine | Admitting: Emergency Medicine

## 2010-10-09 ENCOUNTER — Encounter: Payer: Self-pay | Admitting: Emergency Medicine

## 2010-10-09 DIAGNOSIS — J069 Acute upper respiratory infection, unspecified: Secondary | ICD-10-CM

## 2010-10-09 LAB — CONVERTED CEMR LAB: Rapid Strep: NEGATIVE

## 2010-10-12 ENCOUNTER — Telehealth (INDEPENDENT_AMBULATORY_CARE_PROVIDER_SITE_OTHER): Payer: Self-pay | Admitting: *Deleted

## 2010-12-13 ENCOUNTER — Encounter: Payer: Self-pay | Admitting: Family Medicine

## 2010-12-13 DIAGNOSIS — Q211 Atrial septal defect, unspecified: Secondary | ICD-10-CM | POA: Insufficient documentation

## 2010-12-13 DIAGNOSIS — I471 Supraventricular tachycardia: Secondary | ICD-10-CM | POA: Insufficient documentation

## 2011-02-11 LAB — COMPREHENSIVE METABOLIC PANEL
ALT: 12
AST: 21
Albumin: 3.7
Alkaline Phosphatase: 28 — ABNORMAL LOW
BUN: 16
CO2: 28
Calcium: 9
Chloride: 103
Creatinine, Ser: 0.78
GFR calc Af Amer: >60
GFR calc non Af Amer: >60
Glucose, Bld: 86
Potassium: 3.8
Sodium: 138
Total Bilirubin: 1.1
Total Protein: 6.9

## 2011-02-11 LAB — URINALYSIS, ROUTINE W REFLEX MICROSCOPIC
Glucose, UA: NEGATIVE
Hgb urine dipstick: NEGATIVE
Ketones, ur: 40 — AB
Nitrite: NEGATIVE
Protein, ur: NEGATIVE
Specific Gravity, Urine: 1.033 — ABNORMAL HIGH
Urobilinogen, UA: 1
pH: 6.5

## 2011-02-11 LAB — CBC
HCT: 39.7
Hemoglobin: 13.9
MCHC: 35
MCV: 85.9
Platelets: 220
RBC: 4.62
RDW: 13.1
WBC: 7.5

## 2011-02-11 LAB — LIPASE, BLOOD: Lipase: 17

## 2011-02-11 LAB — DIFFERENTIAL
Basophils Absolute: 0
Basophils Relative: 0
Eosinophils Absolute: 0
Eosinophils Relative: 0
Lymphocytes Relative: 10 — ABNORMAL LOW
Lymphs Abs: 0.7
Monocytes Absolute: 0.5
Monocytes Relative: 7
Neutro Abs: 6.2
Neutrophils Relative %: 83 — ABNORMAL HIGH

## 2011-02-11 LAB — PREGNANCY, URINE: Preg Test, Ur: NEGATIVE

## 2011-04-22 NOTE — Progress Notes (Signed)
Summary: FEVER/HEADACHE/EAR PAIN rm 5   Vital Signs:  Patient Profile:   40 Years Old Female CC:      HA and ear ache x 2-3 days Height:     65 inches Weight:      131.50 pounds O2 Sat:      100 % O2 treatment:    Room Air Temp:     97.9 degrees F oral Pulse rate:   69 / minute Resp:     16 per minute BP sitting:   130 / 77  (left arm) Cuff size:   regular  Vitals Entered By: Clemens Catholic LPN (Oct 09, 2010 3:36 PM)                  Updated Prior Medication List: ATENOLOL 25 MG TABS (ATENOLOL) take one tablet by mouth once a day  Current Allergies (reviewed today): ! DEMEROLHistory of Present Illness History from: patient Chief Complaint: HA and ear ache x 2-3 days History of Present Illness: 40 Years Old Female complains of onset of cold symptoms for 3 days.  Ahliyah has been using no OTC meds. + sore throat No cough No pleuritic pain No wheezing + nasal congestion No post-nasal drainage No sinus pain/pressure No chest congestion No itchy/red eyes + earache No hemoptysis No SOB No chills/sweats + fever No nausea No vomiting No abdominal pain No diarrhea No skin rashes No fatigue No myalgias + headache   REVIEW OF SYSTEMS Constitutional Symptoms       Complains of chills, night sweats, and fatigue.     Denies fever, weight loss, and weight gain.  Eyes       Complains of eye pain.      Denies change in vision, eye discharge, glasses, contact lenses, and eye surgery. Ear/Nose/Throat/Mouth       Complains of ear pain.      Denies hearing loss/aids, change in hearing, ear discharge, dizziness, frequent runny nose, frequent nose bleeds, sinus problems, sore throat, hoarseness, and tooth pain or bleeding.  Respiratory       Complains of shortness of breath.      Denies dry cough, productive cough, wheezing, asthma, bronchitis, and emphysema/COPD.  Cardiovascular       Complains of chest pain.      Denies murmurs and tires easily with exhertion.     Gastrointestinal       Denies stomach pain, nausea/vomiting, diarrhea, constipation, blood in bowel movements, and indigestion.      Comments: nausea Genitourniary       Denies painful urination, kidney stones, and loss of urinary control. Neurological       Complains of headaches.      Denies paralysis, seizures, and fainting/blackouts. Musculoskeletal       Complains of muscle pain and joint pain.      Denies joint stiffness, decreased range of motion, redness, swelling, muscle weakness, and gout.  Skin       Denies bruising, unusual mles/lumps or sores, and hair/skin or nail changes.  Psych       Denies mood changes, temper/anger issues, anxiety/stress, speech problems, depression, and sleep problems. Other Comments: pt c/o HA and ear ache x 2-3 days. Today her throat hurts a little and feels like there is something in her throat. no fever. her daughter recently tested positive for strep. she has taken Advil,   Past History:  Past Medical History: Reviewed history from 08/17/2008 and no changes required. PAT-- sees Dr Clovis Riley at [facility]  PFO/ ASD pap 9-07 in [place]  Past Surgical History: Reviewed history from 12/23/2008 and no changes required. abdominoplasthy  T- tubes, T&A ganglion cyst removal from left wrist  Family History: Reviewed history from 11/23/2007 and no changes required. GM- breast cancer at 64 Mother- MVP Father PTSD, bipolar M aunt breast cancer at 15  Social History: Reviewed history from 11/23/2007 and no changes required. Self employed.  Divorced.  4 kids.  (3 live w/ her).  Boyfriend - Barbara Cower.  Exercises regularly.  Nonsmoker.  1-2 ETOH drinks/ day. Physical Exam General appearance: well developed, well nourished, no acute distress Ears: TM scarring from old surgeries, but no erythema Nasal: mucosa pink, nonedematous, no septal deviation, turbinates normal Oral/Pharynx: clear PND, no erythema or exudates Chest/Lungs: no rales,  wheezes, or rhonchi bilateral, breath sounds equal without effort Heart: regular rate and  rhythm, no murmur MSE: oriented to time, place, and person Assessment New Problems: UPPER RESPIRATORY INFECTION, ACUTE (ICD-465.9)   Plan New Medications/Changes: AMOXICILLIN 875 MG TABS (AMOXICILLIN) 1 by mouth two times a day for 7 days  #14 x 0, 10/09/2010, Hoyt Koch MD  New Orders: Est. Patient Level III [40981] Pulse Oximetry (single measurment) [94760] Rapid Strep [19147] T-Culture, Throat [[id number]] Planning Comments:   1)  Take the prescribed antibiotic as instructed.  Hold for a few days since likely viral.  But will give Rx to you since leaving for the beach soon. If worsening, can take.  Rapid strep negative.  Culture pending. 2)  Use nasal saline solution (over the counter) at least 3 times a day. 3)  Use over the counter decongestants like Zyrtec-D every 12 hours as needed to help with congestion. 4)  Can take tylenol every 6 hours or motrin every 8 hours for pain or fever. 5)  Follow up with your primary doctor  if no improvement in 5-7 days, sooner if increasing pain, fever, or new symptoms.    The patient and/or caregiver has been counseled thoroughly with regard to medications prescribed including dosage, schedule, interactions, rationale for use, and possible side effects and they verbalize understanding.  Diagnoses and expected course of recovery discussed and will return if not improved as expected or if the condition worsens. Patient and/or caregiver verbalized understanding.  Prescriptions: AMOXICILLIN 875 MG TABS (AMOXICILLIN) 1 by mouth two times a day for 7 days  #14 x 0   Entered and Authorized by:   Hoyt Koch MD   Signed by:   Hoyt Koch MD on 10/09/2010   Method used:   Print then Give to Patient   RxID:   [id number]   Orders Added: 1)  Est. Patient Level III [24401] 2)  Pulse Oximetry (single measurment) [94760] 3)  Rapid Strep  [02725] 4)  T-Culture, Throat [[id number]]    Laboratory Results  Date/Time Received: Oct 09, 2010 3:49 PM  Date/Time Reported: Oct 09, 2010 3:49 PM   Other Tests  Rapid Strep: negative  Kit Test Internal QC: Negative   (Normal Range: Negative)

## 2011-04-22 NOTE — Telephone Encounter (Signed)
  Phone Note Outgoing Call Call back at Home Phone [phone] [facility]     Call placed by: Lajean Saver RN,  Oct 12, 2010 11:59 AM Summary of Call: Callback: Patient's voicemail unable to accept messages.

## 2011-06-28 ENCOUNTER — Other Ambulatory Visit: Payer: Self-pay | Admitting: Obstetrics and Gynecology

## 2011-06-28 ENCOUNTER — Other Ambulatory Visit (HOSPITAL_COMMUNITY): Payer: Self-pay | Admitting: Obstetrics and Gynecology

## 2011-06-28 ENCOUNTER — Encounter (HOSPITAL_COMMUNITY): Payer: Self-pay | Admitting: *Deleted

## 2011-06-28 ENCOUNTER — Emergency Department (HOSPITAL_COMMUNITY)
Admission: EM | Admit: 2011-06-28 | Discharge: 2011-06-28 | Disposition: A | Discharge status: Home / Self Care | Payer: BC Managed Care – PPO | Attending: Emergency Medicine | Admitting: Emergency Medicine

## 2011-06-28 ENCOUNTER — Ambulatory Visit (HOSPITAL_COMMUNITY)
Admission: RE | Admit: 2011-06-28 | Discharge: 2011-06-28 | Disposition: A | Discharge status: Home / Self Care | Payer: BC Managed Care – PPO | Source: Ambulatory Visit | Attending: Obstetrics and Gynecology | Admitting: Obstetrics and Gynecology

## 2011-06-28 ENCOUNTER — Emergency Department (HOSPITAL_COMMUNITY): Payer: BC Managed Care – PPO

## 2011-06-28 DIAGNOSIS — N949 Unspecified condition associated with female genital organs and menstrual cycle: Secondary | ICD-10-CM | POA: Insufficient documentation

## 2011-06-28 DIAGNOSIS — Z79899 Other long term (current) drug therapy: Secondary | ICD-10-CM | POA: Insufficient documentation

## 2011-06-28 DIAGNOSIS — R109 Unspecified abdominal pain: Secondary | ICD-10-CM | POA: Insufficient documentation

## 2011-06-28 DIAGNOSIS — R1084 Generalized abdominal pain: Secondary | ICD-10-CM | POA: Insufficient documentation

## 2011-06-28 DIAGNOSIS — N83209 Unspecified ovarian cyst, unspecified side: Secondary | ICD-10-CM | POA: Insufficient documentation

## 2011-06-28 DIAGNOSIS — R102 Pelvic and perineal pain: Secondary | ICD-10-CM

## 2011-06-28 DIAGNOSIS — Z7901 Long term (current) use of anticoagulants: Secondary | ICD-10-CM | POA: Insufficient documentation

## 2011-06-28 DIAGNOSIS — R110 Nausea: Secondary | ICD-10-CM | POA: Insufficient documentation

## 2011-06-28 HISTORY — DX: Supraventricular tachycardia: I47.1

## 2011-06-28 HISTORY — DX: Other supraventricular tachycardia: I47.19

## 2011-06-28 HISTORY — DX: Unspecified ovarian cyst, unspecified side: N83.209

## 2011-06-28 LAB — DIFFERENTIAL
Basophils Absolute: 0.1 10*3/uL (ref 0.0–0.1)
Basophils Relative: 1 % (ref 0–1)
Eosinophils Absolute: 0.1 10*3/uL (ref 0.0–0.7)
Eosinophils Relative: 2 % (ref 0–5)
Lymphocytes Relative: 39 % (ref 12–46)
Lymphs Abs: 2.3 10*3/uL (ref 0.7–4.0)
Monocytes Absolute: 0.5 10*3/uL (ref 0.1–1.0)
Monocytes Relative: 9 % (ref 3–12)
Neutro Abs: 2.9 10*3/uL (ref 1.7–7.7)
Neutrophils Relative %: 49 % (ref 43–77)

## 2011-06-28 LAB — COMPREHENSIVE METABOLIC PANEL
ALT: 6 U/L (ref 0–35)
AST: 16 U/L (ref 0–37)
Albumin: 3.7 g/dL (ref 3.5–5.2)
Alkaline Phosphatase: 37 U/L — ABNORMAL LOW (ref 39–117)
BUN: 9 mg/dL (ref 6–23)
CO2: 25 mEq/L (ref 19–32)
Calcium: 9.1 mg/dL (ref 8.4–10.5)
Chloride: 104 mEq/L (ref 96–112)
Creatinine, Ser: 0.64 mg/dL (ref 0.50–1.10)
GFR calc Af Amer: >90 mL/min (ref >90)
GFR calc non Af Amer: >90 mL/min (ref >90)
Glucose, Bld: 120 mg/dL — ABNORMAL HIGH (ref 70–99)
Potassium: 3.5 mEq/L (ref 3.5–5.1)
Sodium: 139 mEq/L (ref 135–145)
Total Bilirubin: 0.3 mg/dL (ref 0.3–1.2)
Total Protein: 6.8 g/dL (ref 6.0–8.3)

## 2011-06-28 LAB — URINALYSIS, ROUTINE W REFLEX MICROSCOPIC
Bilirubin Urine: NEGATIVE
Glucose, UA: NEGATIVE mg/dL
Hgb urine dipstick: NEGATIVE
Ketones, ur: NEGATIVE mg/dL
Leukocytes, UA: NEGATIVE
Nitrite: NEGATIVE
Protein, ur: NEGATIVE mg/dL
Specific Gravity, Urine: 1.007 (ref 1.005–1.030)
Urobilinogen, UA: 0.2 mg/dL (ref 0.0–1.0)
pH: 7 (ref 5.0–8.0)

## 2011-06-28 LAB — CBC
HCT: 33.4 % — ABNORMAL LOW (ref 36.0–46.0)
Hemoglobin: 11.4 g/dL — ABNORMAL LOW (ref 12.0–15.0)
MCH: 29.9 pg (ref 26.0–34.0)
MCHC: 34.1 g/dL (ref 30.0–36.0)
MCV: 87.7 fL (ref 78.0–100.0)
Platelets: 279 10*3/uL (ref 150–400)
RBC: 3.81 MIL/uL — ABNORMAL LOW (ref 3.87–5.11)
RDW: 12.8 % (ref 11.5–15.5)
WBC: 6 10*3/uL (ref 4.0–10.5)

## 2011-06-28 LAB — POCT PREGNANCY, URINE: Preg Test, Ur: NEGATIVE

## 2011-06-28 LAB — LIPASE, BLOOD: Lipase: 21 U/L (ref 11–59)

## 2011-06-28 MED ORDER — SODIUM CHLORIDE 0.9 % IV BOLUS (SEPSIS): 500.0000 mL | Freq: Once | INTRAVENOUS | Status: DC

## 2011-06-28 MED ORDER — SODIUM CHLORIDE 0.9 % IV SOLN
INTRAVENOUS | Status: DC
  Administered 2011-06-28: 17:00:00 via INTRAVENOUS

## 2011-06-28 MED ORDER — IOHEXOL 300 MG/ML  SOLN: 80.0000 mL | Freq: Once | INTRAMUSCULAR | Status: DC | PRN

## 2011-06-28 MED ORDER — ONDANSETRON HCL 4 MG/2ML IJ SOLN
4.0000 mg | INTRAMUSCULAR | Status: DC | PRN
  Administered 2011-06-28: 4 mg via INTRAVENOUS
  Filled 2011-06-28: qty 2

## 2011-06-28 MED ORDER — MORPHINE SULFATE 2 MG/ML IJ SOLN
2.0000 mg | INTRAMUSCULAR | Status: DC | PRN
  Administered 2011-06-28: 2 mg via INTRAVENOUS
  Filled 2011-06-28: qty 1

## 2011-06-28 NOTE — ED Notes (Signed)
Pt in from Dr Layne Benton, pt seen MD due to abd pain and [facility], sent here due to finding of free fluid from unknown source. Pt c/o abd pain and nausea at this time, pain since wednesday

## 2011-06-28 NOTE — ED Notes (Signed)
Pt c/o of abdominal pain after fall on Wednesday evening, abdominal pain has gotten worse since Wednesday, pt describes pain as sharp stabbing pain that radiates to throughout the abdomin. Pt states she is concerned because he has a heart ablation done on June 07, 2011 and is taking blood thinners

## 2011-06-28 NOTE — ED Provider Notes (Signed)
History     CSN: [id number]  Arrival date & time 06/28/11  1535   First MD Initiated Contact with Patient 06/28/11 1548      Chief Complaint  Patient presents with  . Abdominal Pain    HPI Pt was seen at 1550.  Per pt, c/o gradual onset and persistence of constant abd "pain" x2 days.  Pt states the pain began at her periumbilical abd area, then moved to her RLQ, then became diffuse.  Describes the pain as "sharp," "stabbing" and has been assoc with nausea.  Pt was eval by her OB/GYN today, had an US pelvis completed.  Pt states she was told her [place] had "free fluid" and "normal ovaries," and she was sent to ED for further eval with a CT scan.  Last PO intake PTA to this ED.  Denies CP/SOB, no lightheadedness, no back/flank pain, no fevers, no N/V/D.    OB/GYN:  Dr. Ellyn Hack Past Medical History  Diagnosis Date  . Atrial tachycardia     Past Surgical History  Procedure Date  . Ablasion   . Tubal ligation      History  Substance Use Topics  . Smoking status: Never Smoker   . Smokeless tobacco: Not on file  . Alcohol Use: Yes     Review of Systems ROS: Statement: All systems negative except as marked or noted in the HPI; Constitutional: Negative for fever and chills. ; ; Eyes: Negative for eye pain, redness and discharge. ; ; ENMT: Negative for ear pain, hoarseness, nasal congestion, sinus pressure and sore throat. ; ; Cardiovascular: Negative for chest pain, palpitations, diaphoresis, dyspnea and peripheral edema. ; ; Respiratory: Negative for cough, wheezing and stridor. ; ; Gastrointestinal: +nausea, abd pain. Negative for vomiting, diarrhea, blood in stool, hematemesis, jaundice and rectal bleeding. . ; ; Genitourinary: Negative for dysuria, flank pain and hematuria.; GYN:  No vaginal bleeding, no vaginal discharge, no vulvar pain.; Musculoskeletal: Negative for back pain and neck pain. Negative for swelling and trauma.; ; Skin: Negative for pruritus, rash, abrasions, blisters,  bruising and skin lesion.; ; Neuro: Negative for headache, lightheadedness and neck stiffness. Negative for weakness, altered level of consciousness , altered mental status, extremity weakness, paresthesias, involuntary movement, seizure and syncope.     Allergies  Demerol and Meperidine hcl  Home Medications   Current Outpatient Rx  Name Route Sig Dispense Refill  . ATENOLOL 25 MG PO TABS Oral Take 25 mg by mouth daily.    . CO Q 10 10 MG PO CAPS Oral Take 1 tablet by mouth daily.    Marland Kitchen CONJ ESTROG-MEDROXYPROGEST ACE 0.45-1.5 MG PO TABS Oral Take 1 tablet by mouth daily.    . MULTIVITAMINS PO TABS Oral Take 1 tablet by mouth daily.    Marland Kitchen RIVAROXABAN 15 MG PO TABS Oral Take 20 mg by mouth daily.      BP 107/67  Pulse 66  Temp(Src) 98.7 F (37.1 C) (Oral)  Resp 20  SpO2 100%  Physical Exam 1555: Physical examination:  Nursing notes reviewed; Vital signs and O2 SAT reviewed;  Constitutional: Well developed, Well nourished, Well hydrated, In no acute distress; Head:  Normocephalic, atraumatic; Eyes: EOMI, PERRL, No scleral icterus; ENMT: Mouth and pharynx normal, Mucous membranes moist; Neck: Supple, Full range of motion, No lymphadenopathy; Cardiovascular: Regular rate and rhythm, No murmur, rub, or gallop; Respiratory: Breath sounds clear & equal bilaterally, No rales, rhonchi, wheezes, or rub, Normal respiratory effort/excursion; Chest: Nontender, Movement normal; Abdomen: Soft, +diffusely  tender to palp, esp RLQ and LLQ, Nondistended, Normal bowel sounds; Extremities: Pulses normal, No tenderness, No edema, No calf edema or asymmetry.; Neuro: AA&Ox3, Major CN grossly intact.  No gross focal motor or sensory deficits in extremities.; Skin: Color normal, Warm, Dry, no rash.   ED Course  Procedures   2030:  Pt not orthostatic.  States she feels "fine" just having intermittent "twinges" of abd pain.  Denies SOB, lightheadedness, CP, or worsening abd/pelvic pain.  Concern pt is on xarelto  (blood thinner) with 2 ruptured hemorrhagic ovarian cysts.  Needs serial H/H checks.  Dx testing, as well as my concern regarding need for H/H checks, d/w pt and family.  Questions answered.  Verb understanding.  Absolutely refuses to be admitted for observation.  Risks/benefits of admission discussed with pt and husband.  Pt refuses again.  Pt makes her own medical decisions.  Agreeable to have me call her OB/GYN Dr. Ellyn Hack.  2045:  T/C to OB/GYN Dr. Ellyn Hack, case discussed, including:  HPI, pertinent PM/SHx, VS/PE, dx testing, ED course and treatment:  She is aware that pt does not want to be admitted for observation (even after I speak to pt again while I was on the phone with her), pt is agreeable however to go to [facility] tomorrow morning for a recheck of her H/H at 0800, Dr. Ellyn Hack states she will see her at that time; Dr. Ellyn Hack requests to inform pt she will have to stay after her lab draw in the morning and to prepare for that (pt is agreeable), take tylenol prn pain, heating pad to abd.  This discussion d/w pt and her husband.  Both verb understanding.  Pt wants to go home now.  Husband wants to take her home.  Asked again if she would be admitted for observation to re-check her H/H and pain control, again refuses.  Pt makes her own medical decisions.  Will d/c home with f/u tomorrow morning.    MDM  MDM Reviewed: nursing note and vitals Reviewed previous: ultrasound Interpretation: CT scan and labs   US Pelvis Complete 06/28/2011  *RADIOLOGY REPORT*  Clinical Data: Lower abdominal and pelvic pain; fall  TRANSABDOMINAL AND TRANSVAGINAL ULTRASOUND OF PELVIS Technique:  Both transabdominal and transvaginal ultrasound examinations of the pelvis were performed. Transabdominal technique was performed for global imaging of the pelvis including uterus, ovaries, adnexal regions, and pelvic cul-de-sac.  Comparison: None.   It was necessary to proceed with endovaginal exam following the  transabdominal exam to visualize the adnexa and right lower quadrant.  Findings: The uterus has a normal size and echotexture, measuring 8.8 x 4.0 x 6.3 cm.  The endometrial stripe is thin and homogeneous, measuring 10 mm in width.  Both ovaries have a normal size and appearance.  The right ovary measures 4.3 x 1.5 x 1.7 centimeters, and the left ovary measures 1.9 x 5.3 x 2.8 cm.  Small follicular cysts are present on both ovaries.  There is a small amount of free fluid within the pelvis. There is also a small amount of free fluid along both pericolic gutters, right greater than left.  The appendix is not identified.  The  IMPRESSION: Normal uterus and ovaries bilaterally.  There is a small amount of free fluid within the cul-de-sac and along both pericolic gutters, right greater than left.  This is nonspecific in a female patient this age.  The appendix is not identified; therefore, appendicitis cannot be excluded on this study.  CT scan with IV  and oral contrast is recommended if appendicitis is suspected.  Original Report Authenticated By: Brandon Melnick, M.D.    Results for orders placed during the hospital encounter of 06/28/11  URINALYSIS, ROUTINE W REFLEX MICROSCOPIC      Component Value Range   Color, Urine YELLOW  YELLOW    APPearance CLEAR  CLEAR    Specific Gravity, Urine 1.007  1.005 - 1.030    pH 7.0  5.0 - 8.0    Glucose, UA NEGATIVE  NEGATIVE (mg/dL)   Hgb urine dipstick NEGATIVE  NEGATIVE    Bilirubin Urine NEGATIVE  NEGATIVE    Ketones, ur NEGATIVE  NEGATIVE (mg/dL)   Protein, ur NEGATIVE  NEGATIVE (mg/dL)   Urobilinogen, UA 0.2  0.0 - 1.0 (mg/dL)   Nitrite NEGATIVE  NEGATIVE    Leukocytes, UA NEGATIVE  NEGATIVE   CBC      Component Value Range   WBC 6.0  4.0 - 10.5 (K/uL)   RBC 3.81 (*) 3.87 - 5.11 (MIL/uL)   Hemoglobin 11.4 (*) 12.0 - 15.0 (g/dL)   HCT 16.1 (*) 09.6 - 46.0 (%)   MCV 87.7  78.0 - 100.0 (fL)   MCH 29.9  26.0 - 34.0 (pg)   MCHC 34.1  30.0 - 36.0 (g/dL)    RDW 04.5  40.9 - 81.1 (%)   Platelets 279  150 - 400 (K/uL)  DIFFERENTIAL      Component Value Range   Neutrophils Relative 49  43 - 77 (%)   Neutro Abs 2.9  1.7 - 7.7 (K/uL)   Lymphocytes Relative 39  12 - 46 (%)   Lymphs Abs 2.3  0.7 - 4.0 (K/uL)   Monocytes Relative 9  3 - 12 (%)   Monocytes Absolute 0.5  0.1 - 1.0 (K/uL)   Eosinophils Relative 2  0 - 5 (%)   Eosinophils Absolute 0.1  0.0 - 0.7 (K/uL)   Basophils Relative 1  0 - 1 (%)   Basophils Absolute 0.1  0.0 - 0.1 (K/uL)  COMPREHENSIVE METABOLIC PANEL      Component Value Range   Sodium 139  135 - 145 (mEq/L)   Potassium 3.5  3.5 - 5.1 (mEq/L)   Chloride 104  96 - 112 (mEq/L)   CO2 25  19 - 32 (mEq/L)   Glucose, Bld 120 (*) 70 - 99 (mg/dL)   BUN 9  6 - 23 (mg/dL)   Creatinine, Ser 9.14  0.50 - 1.10 (mg/dL)   Calcium 9.1  8.4 - 78.2 (mg/dL)   Total Protein 6.8  6.0 - 8.3 (g/dL)   Albumin 3.7  3.5 - 5.2 (g/dL)   AST 16  0 - 37 (U/L)   ALT 6  0 - 35 (U/L)   Alkaline Phosphatase 37 (*) 39 - 117 (U/L)   Total Bilirubin 0.3  0.3 - 1.2 (mg/dL)   GFR calc non Af Amer >90  >90 (mL/min)   GFR calc Af Amer >90  >90 (mL/min)  LIPASE, BLOOD      Component Value Range   Lipase 21  11 - 59 (U/L)  POCT PREGNANCY, URINE      Component Value Range   Preg Test, Ur NEGATIVE  NEGATIVE     Ct Abdomen Pelvis W Contrast 06/28/2011  *RADIOLOGY REPORT*  Clinical Data: Pelvic free fluid noted on the pelvic ultrasound today  CT ABDOMEN AND PELVIS WITH CONTRAST  Technique:  Multidetector CT imaging of the abdomen and pelvis was performed following the standard  protocol during bolus administration of intravenous contrast.  Contrast:  . 80 ml of Omnipaque-300  Comparison: None.  Findings: Lung bases are unremarkable.  Enhanced liver, spleen, pancreas and adrenals are unremarkable.  Gallbladder is contracted without evidence of calcified gallstones.  Kidneys are symmetrical in size and enhancement without focal renal mass.  No hydronephrosis or  hydroureter.  Abdominal aorta is unremarkable.  Sagittal images of the spine shows no destructive bony lesions.  No pelvic fractures are identified.  There are no small bowel obstruction.  No ascites or free air.  No adenopathy.  There is a low-lying cecum.  There is no evidence of pericecal inflammation. Stool noted within cecum.  The appendix is not clearly identified. The 12/19/2009 structure in the coronal image 12 may represent partially visualized appendix just inferior to the cecum.  Has normal appearance.  The urinary bladder is unremarkable.  There is small amount of hemorrhagic free fluid in posterior cul-de-sac. There is a collapsed hemorrhagic cyst in the left ovary measures 1.7 cm.  A collapsed probable hemorrhagic cyst is noted in the right ovary in the right posterior cul-de-sac measures about 1.6 cm.  The findings are suspicious for recent ruptured ovarian cysts. Clinical correlation is necessary.  No distal colonic obstruction is noted.  IMPRESSION:  1.  There is small amount of hemorrhagic free fluid within posterior cul-de-sac.  A collapsed cyst is noted in the left ovary measures 1.7 cm.  There is collapsed elongated cyst in the right ovary measures 1.6 cm.  Findings are suspicious for recent ruptured hemorrhagic ovarian cysts.  Clinical correlation is necessary.  2.  There is a low-lying cecum.  Stool noted within cecum. Appendix is not definitely identified.  There is no pericecal inflammation. 3.  No hydronephrosis or hydroureter. 4.  No small bowel obstruction.  Original Report Authenticated By: Natasha Mead, M.D.         Laray Anger, DO 06/30/11 [phone]

## 2011-06-30 LAB — URINE CULTURE
Colony Count: NO GROWTH
Culture  Setup Time: 201302090140
Culture: NO GROWTH

## 2011-11-01 ENCOUNTER — Other Ambulatory Visit: Payer: Self-pay | Admitting: Obstetrics and Gynecology

## 2011-11-01 DIAGNOSIS — Z1231 Encounter for screening mammogram for malignant neoplasm of breast: Secondary | ICD-10-CM

## 2012-03-12 ENCOUNTER — Encounter: Payer: Self-pay | Admitting: Family Medicine

## 2012-03-12 ENCOUNTER — Ambulatory Visit (INDEPENDENT_AMBULATORY_CARE_PROVIDER_SITE_OTHER): Payer: BC Managed Care – PPO | Admitting: Family Medicine

## 2012-03-12 VITALS — BP 114/71 | HR 70 | Ht 65.0 in | Wt 144.0 lb

## 2012-03-12 DIAGNOSIS — M76899 Other specified enthesopathies of unspecified lower limb, excluding foot: Secondary | ICD-10-CM

## 2012-03-12 DIAGNOSIS — M7060 Trochanteric bursitis, unspecified hip: Secondary | ICD-10-CM

## 2012-03-12 DIAGNOSIS — M25559 Pain in unspecified hip: Secondary | ICD-10-CM

## 2012-03-12 DIAGNOSIS — M25551 Pain in right hip: Secondary | ICD-10-CM

## 2012-03-12 MED ORDER — LIDOCAINE HCL 1 % IJ SOLN
0.9000 mL | Freq: Once | INTRAMUSCULAR | Status: AC
  Administered 2012-03-12: 0.9 mL

## 2012-03-12 MED ORDER — METHYLPREDNISOLONE ACETATE 40 MG/ML IJ SUSP
40.0000 mg | Freq: Once | INTRAMUSCULAR | Status: AC
  Administered 2012-03-12: 40 mg via INTRA_ARTICULAR

## 2012-03-12 NOTE — Progress Notes (Signed)
  Subjective:    Patient ID: [name], female    DOB: Mar 26, 1971, 41 y.o.   MRN: [id number]  HPI Right hip pain on and off for 3 weeks. Says has some herniated disc in her lumbar spine but says this feels different.  Pain is distal to her trochanter. Can happen anytime at rest or at exercise. Says is sudden, sharp.  No injuries.  No rubbing sensation.  tooks some diclofenac from her neurologist and didn't really help.  Occ feel slike a dull ache.  Her back was hurting recently.  Had a sore on her lower back tha that has a scab on it.  WAs there for a couple of weeks.  It felt sore to touch. No itching.    Review of Systems     Objective:   Physical Exam  Constitutional: She appears well-developed and well-nourished.  HENT:  Head: Normocephalic and atraumatic.  Musculoskeletal:       Right hip, tender over the trochanteric bursa. Hip with normal range of motion. No pain limitations or tenderness. Hip strength is 5 out of 5  Skin: Skin is warm and dry.  Psychiatric: She has a normal mood and affect. Her behavior is normal.          Assessment & Plan:  Right lateral hip pain - her history is not exactly classic for trochanteric bursitis but she is tender in the right location. We discussed the option of treating for trochanter bursitis conservatively with stretches exercises (H.O. Given) and injection today. If she's not better in one to 2 weeks and please call the office. If she's not improved and I suspect this may be coming from something else that needs to be worked up further.  Bursa  Injection Procedure Note  Pre-operative Diagnosis: right Trochanteric bursitis  Post-operative Diagnosis: normal  Indications: Diagnosis and treatment of symptomatic bursal effusion  Anesthesia: Lidocaine 1% with epinephrine   Procedure Details   After a discussion of the risks and benefits with the patient (including the possibility that any manipulation of the bursa could introduce  infection, worsening the current situation significantly), verbal consent was obtained for the procedure. The joint was prepped with Betadine. An 18 gauge needle was introduced and 12m of 40mg  kenalog and 9 ml of lidocain 1%  Was injected.  The injection site was cleansed with topical isopropyl alcohol and a dressing was applied.  Complications:  None; patient tolerated the procedure well.

## 2012-03-12 NOTE — Patient Instructions (Signed)
Hip Bursitis Bursitis is a swelling and soreness (inflammation) of a fluid-filled sac (bursa). This sac overlies and protects the joints.  CAUSES   Injury.  Overuse of the muscles surrounding the joint.  Arthritis.  Gout.  Infection.  Cold weather.  Inadequate warm-up and conditioning prior to activities. The cause may not be known.  SYMPTOMS   Mild to severe irritation.  Tenderness and swelling over the outside of the hip.  Pain with motion of the hip.  If the bursa becomes infected, a fever may be present. Redness, tenderness, and warmth will develop over the hip. Symptoms usually lessen in 3 to 4 weeks with treatment, but can come back. TREATMENT If conservative treatment does not work, your caregiver may advise draining the bursa and injecting cortisone into the area. This may speed up the healing process. This may also be used as an initial treatment of choice. HOME CARE INSTRUCTIONS   Apply ice to the affected area for 15 to 20 minutes every 3 to 4 hours while awake for the first 2 days. Put the ice in a plastic bag and place a towel between the bag of ice and your skin.  Rest the painful joint as much as possible, but continue to put the joint through a normal range of motion at least 4 times per day. When the pain lessens, begin normal, slow movements and usual activities to help prevent stiffness of the hip.  Only take over-the-counter or prescription medicines for pain, discomfort, or fever as directed by your caregiver.  Use crutches to limit weight bearing on the hip joint, if advised.  Elevate your painful hip to reduce swelling. Use pillows for propping and cushioning your legs and hips.  Gentle massage may provide comfort and decrease swelling. SEEK IMMEDIATE MEDICAL CARE IF:   Your pain increases even during treatment, or you are not improving.  You have a fever.  You have heat and inflammation over the involved bursa.  You have any other questions  or concerns. MAKE SURE YOU:   Understand these instructions.  Will watch your condition.  Will get help right away if you are not doing well or get worse. Document Released: 10/26/2001 Document Revised: 07/29/2011 Document Reviewed: 05/25/2008 [employer] Patient Information 2013 [employer].  

## 2012-05-18 ENCOUNTER — Ambulatory Visit
Admission: RE | Admit: 2012-05-18 | Discharge: 2012-05-18 | Disposition: A | Discharge status: Home / Self Care | Payer: BC Managed Care – PPO | Source: Ambulatory Visit | Attending: Sports Medicine | Admitting: Sports Medicine

## 2012-05-18 ENCOUNTER — Other Ambulatory Visit: Payer: Self-pay | Admitting: Sports Medicine

## 2012-05-18 DIAGNOSIS — M25562 Pain in left knee: Secondary | ICD-10-CM

## 2012-08-27 ENCOUNTER — Encounter: Payer: Self-pay | Admitting: Physician Assistant

## 2012-08-27 ENCOUNTER — Ambulatory Visit (INDEPENDENT_AMBULATORY_CARE_PROVIDER_SITE_OTHER): Payer: BC Managed Care – PPO | Admitting: Physician Assistant

## 2012-08-27 VITALS — BP 117/71 | HR 70 | Wt 138.0 lb

## 2012-08-27 DIAGNOSIS — A6000 Herpesviral infection of urogenital system, unspecified: Secondary | ICD-10-CM

## 2012-08-27 DIAGNOSIS — F4323 Adjustment disorder with mixed anxiety and depressed mood: Secondary | ICD-10-CM

## 2012-08-27 MED ORDER — ALPRAZOLAM 0.5 MG PO TABS: 0.5000 mg | ORAL_TABLET | Freq: Every evening | ORAL | Status: DC | PRN

## 2012-08-27 MED ORDER — VALACYCLOVIR HCL 1 G PO TABS: 1000.0000 mg | ORAL_TABLET | Freq: Every day | ORAL | Status: DC

## 2012-08-27 MED ORDER — DESVENLAFAXINE SUCCINATE ER 50 MG PO TB24: 50.0000 mg | ORAL_TABLET | Freq: Every day | ORAL | Status: DC

## 2012-08-27 MED ORDER — VALACYCLOVIR HCL 1 G PO TABS: 1000.0000 mg | ORAL_TABLET | Freq: Two times a day (BID) | ORAL | Status: DC

## 2012-08-27 NOTE — Progress Notes (Addendum)
  Subjective:    Patient ID: [name], female    DOB: Mar 20, 1971, 42 y.o.   MRN: [id number]  HPI Patient is a 42 year old female who presents to clinic with recent stress and anxiety. She recently got out of a 5 year relationship with a boyfriend who lied and cheated on her repeatedly. She feels like she has had 5 years of ups and downs and not knowning the truth. She recently sold her house and the house she was going to buy did not go through and her and her kids are living in a apartment. She finds herself wanting to drink to feel better. She is out of the relationship and has good friends to talk to. She needed medication once before while going through her divorce. She tried Zoloft but gave her too many side effects sexually. Wellbutrin did help. She denies any thoughts of suicide or harming others.  She has a hx of PSVT but resolved with ablation. She denies any palpitations or Chest pains. She does have attacks where she cries and feels out of control. She is having problems going to sleep.   She would like continual therapy for genital herpes. She used to take valtrex as needed but ready to stay ahead of it. Not in a current flare. Last flare was 2 weeks ago but having the more frequently due to stress. On average monthly.      Review of Systems     Objective:   Physical Exam  Constitutional: She is oriented to person, place, and time. She appears well-developed and well-nourished.  HENT:  Head: Normocephalic and atraumatic.  Cardiovascular: Normal rate, regular rhythm and normal heart sounds.   Pulmonary/Chest: Effort normal and breath sounds normal. She has no wheezes.  Neurological: She is alert and oriented to person, place, and time.  Skin: Skin is warm and dry.  Psychiatric: She has a normal mood and affect. Her behavior is normal.          Assessment & Plan:  Anxiety/Depression- GAD-7 is 19. PHQ-9 was 17.discussed starting wellbutrin but patient wanted something  different and newer to try. Gave samples of Pristiq daily for 2weeks gave script for 1 month. Follow up in 6 weeks. Gave xanax to help with sleep and for any acute attacks. Encouraged counseling. Pt stated she had lots of good friends. Talked about side effects and to call if had any worsening thoughts. Does take up to 6 weeks for maximum benefit.  Genital herpes- Gave valtrex 1000mg  daily for 1 year if less than 10 outbreaks a year then will decrease to 500mg  daily.   Spent 30 minutes with patient and greater than 50 percent of encounter spent counseling regarding anxiety/depression.

## 2012-08-27 NOTE — Patient Instructions (Addendum)
Start pristiq. Use xanax as needed.   Valtrex daily for suppression.   Follow up in 6 weeks.

## 2012-12-01 ENCOUNTER — Other Ambulatory Visit: Payer: Self-pay

## 2012-12-01 DIAGNOSIS — Z1231 Encounter for screening mammogram for malignant neoplasm of breast: Secondary | ICD-10-CM

## 2012-12-18 ENCOUNTER — Ambulatory Visit
Admission: RE | Admit: 2012-12-18 | Discharge: 2012-12-18 | Disposition: A | Discharge status: Home / Self Care | Payer: BC Managed Care – PPO | Source: Ambulatory Visit

## 2012-12-18 DIAGNOSIS — Z1231 Encounter for screening mammogram for malignant neoplasm of breast: Secondary | ICD-10-CM

## 2013-08-11 ENCOUNTER — Encounter: Payer: Self-pay | Admitting: Physician Assistant

## 2013-08-11 ENCOUNTER — Ambulatory Visit (INDEPENDENT_AMBULATORY_CARE_PROVIDER_SITE_OTHER): Payer: BC Managed Care – PPO | Admitting: Physician Assistant

## 2013-08-11 VITALS — BP 119/81 | HR 91 | Ht 65.0 in | Wt 137.0 lb

## 2013-08-11 DIAGNOSIS — R1310 Dysphagia, unspecified: Secondary | ICD-10-CM

## 2013-08-11 DIAGNOSIS — R220 Localized swelling, mass and lump, head: Secondary | ICD-10-CM

## 2013-08-11 DIAGNOSIS — R221 Localized swelling, mass and lump, neck: Secondary | ICD-10-CM

## 2013-08-11 DIAGNOSIS — R631 Polydipsia: Secondary | ICD-10-CM

## 2013-08-11 DIAGNOSIS — R42 Dizziness and giddiness: Secondary | ICD-10-CM

## 2013-08-11 LAB — COMPLETE METABOLIC PANEL WITH GFR
ALT: 9 U/L (ref 0–35)
AST: 18 U/L (ref 0–37)
Albumin: 4.7 g/dL (ref 3.5–5.2)
Alkaline Phosphatase: 46 U/L (ref 39–117)
BUN: 8 mg/dL (ref 6–23)
CO2: 31 mEq/L (ref 19–32)
Calcium: 9.7 mg/dL (ref 8.4–10.5)
Chloride: 98 mEq/L (ref 96–112)
Creat: 0.7 mg/dL (ref 0.50–1.10)
GFR, Est African American: >89 mL/min
GFR, Est Non African American: >89 mL/min
Glucose, Bld: 87 mg/dL (ref 70–99)
Potassium: 3.7 mEq/L (ref 3.5–5.3)
Sodium: 139 mEq/L (ref 135–145)
Total Bilirubin: 0.5 mg/dL (ref 0.2–1.2)
Total Protein: 7.4 g/dL (ref 6.0–8.3)

## 2013-08-11 LAB — CBC WITH DIFFERENTIAL/PLATELET
Basophils Absolute: 0.1 10*3/uL (ref 0.0–0.1)
Basophils Relative: 1 % (ref 0–1)
Eosinophils Absolute: 0.1 10*3/uL (ref 0.0–0.7)
Eosinophils Relative: 1 % (ref 0–5)
HCT: 45.7 % (ref 36.0–46.0)
Hemoglobin: 15.8 g/dL — ABNORMAL HIGH (ref 12.0–15.0)
Lymphocytes Relative: 12 % (ref 12–46)
Lymphs Abs: 1.5 10*3/uL (ref 0.7–4.0)
MCH: 30 pg (ref 26.0–34.0)
MCHC: 34.6 g/dL (ref 30.0–36.0)
MCV: 86.7 fL (ref 78.0–100.0)
Monocytes Absolute: 1.1 10*3/uL — ABNORMAL HIGH (ref 0.1–1.0)
Monocytes Relative: 9 % (ref 3–12)
Neutro Abs: 9.4 10*3/uL — ABNORMAL HIGH (ref 1.7–7.7)
Neutrophils Relative %: 77 % (ref 43–77)
Platelets: 325 10*3/uL (ref 150–400)
RBC: 5.27 MIL/uL — ABNORMAL HIGH (ref 3.87–5.11)
RDW: 13.7 % (ref 11.5–15.5)
WBC: 12.2 10*3/uL — ABNORMAL HIGH (ref 4.0–10.5)

## 2013-08-11 LAB — T4, FREE: Free T4: 1.53 ng/dL (ref 0.80–1.80)

## 2013-08-11 LAB — TSH: TSH: 1.163 u[IU]/mL (ref 0.350–4.500)

## 2013-08-11 LAB — T3, FREE: T3, Free: 3 pg/mL (ref 2.3–4.2)

## 2013-08-11 MED ORDER — METHYLPREDNISOLONE (PAK) 4 MG PO TABS: ORAL_TABLET | ORAL | Status: DC

## 2013-08-11 NOTE — Patient Instructions (Addendum)
flonase daily. Zyrtec.   Benign Positional Vertigo Vertigo means you feel like you or your surroundings are moving when they are not. Benign positional vertigo is the most common form of vertigo. Benign means that the cause of your condition is not serious. Benign positional vertigo is more common in older adults. CAUSES  Benign positional vertigo is the result of an upset in the labyrinth system. This is an area in the middle ear that helps control your balance. This may be caused by a viral infection, head injury, or repetitive motion. However, often no specific cause is found. SYMPTOMS  Symptoms of benign positional vertigo occur when you move your head or eyes in different directions. Some of the symptoms may include:  Loss of balance and falls.  Vomiting.  Blurred vision.  Dizziness.  Nausea.  Involuntary eye movements (nystagmus). DIAGNOSIS  Benign positional vertigo is usually diagnosed by physical exam. If the specific cause of your benign positional vertigo is unknown, your caregiver may perform imaging tests, such as magnetic resonance imaging (MRI) or computed tomography (CT). TREATMENT  Your caregiver may recommend movements or procedures to correct the benign positional vertigo. Medicines such as meclizine, benzodiazepines, and medicines for nausea may be used to treat your symptoms. In rare cases, if your symptoms are caused by certain conditions that affect the inner ear, you may need surgery. HOME CARE INSTRUCTIONS   Follow your caregiver's instructions.  Move slowly. Do not make sudden body or head movements.  Avoid driving.  Avoid operating heavy machinery.  Avoid performing any tasks that would be dangerous to you or others during a vertigo episode.  Drink enough fluids to keep your urine clear or pale yellow. SEEK IMMEDIATE MEDICAL CARE IF:   You develop problems with walking, weakness, numbness, or using your arms, hands, or legs.  You have difficulty  speaking.  You develop severe headaches.  Your nausea or vomiting continues or gets worse.  You develop visual changes.  Your family or friends notice any behavioral changes.  Your condition gets worse.  You have a fever.  You develop a stiff neck or sensitivity to light. MAKE SURE YOU:   Understand these instructions.  Will watch your condition.  Will get help right away if you are not doing well or get worse. Document Released: 02/11/2006 Document Revised: 07/29/2011 Document Reviewed: 01/24/2011 [facility] Patient Information 2014 [place].

## 2013-08-11 NOTE — Progress Notes (Signed)
   Subjective:    Patient ID: [name], female    DOB: [birth year], 43 y.o.   MRN: [id number]  HPI Pt presents to the clinic with what feels like lump in throat for the last 4 days. She denies any choking but feels like she has to focus to swallow. Feels flushed and dizzy everytime she eats. Denies any cough, SOB, wheezing, ST, sinus pressure or ear pain. No ringing in ears. She feels like vision is getting worse but just went to eye doctor and said no changes to rx. Always constipated. She has been hypogylemic before and symptoms not consistent. The worst was after a small glass of vodka and orange juice she felts so dizzy and out of it. She is losing weight without trying 10lbs in last 2 weeks. Denies any burning or abdominal pain. No signs of reflux issues. No syncope or blacking out.    Review of Systems     Objective:   Physical Exam  Constitutional: She is oriented to person, place, and time. She appears well-developed and well-nourished.  HENT:  Head: Normocephalic and atraumatic.  Right Ear: External ear normal.  Left Ear: External ear normal.  Nose: Nose normal.  Mouth/Throat: Oropharynx is clear and moist.  TM's clear;however, lots of scarring.   Eyes: Conjunctivae are normal. Right eye exhibits no discharge. Left eye exhibits no discharge.  Neck: Normal range of motion. Neck supple. No thyromegaly present.  Cardiovascular: Normal rate, regular rhythm and normal heart sounds.   Pulmonary/Chest: Effort normal and breath sounds normal.  Noticed when coughed on exam seconds later she stated she felt dizzy.   Lymphadenopathy:    She has no cervical adenopathy.  Neurological: She is alert and oriented to person, place, and time.  dix hallpike dizziness to the left and to the right no nystagmus.   Psychiatric: She has a normal mood and affect. Her behavior is normal.          Assessment & Plan:  Problems swallowing/lump in throa- certainly concerned could be thyroid  enlargement. I did not find anything of physical exam. Will check TSH levels and get thyroid ultrasound. DDx: GERD, PND. Discussed zyrtec/claritin daily for next week. Follow up if not improving.   Dizziness- since with movement got orthostatics and were negative. Dixhallpike ellicited dizzy feelings but no nystagmus. Will treat for BPV. Gave epleys to start multiple times a day up to 3 times a day and see if helps. Will check electrolytes and CBC. Unclear etiology at this point. Hx of PSVT and ASD but no palpitations.from PE it almost seems like she is currently very sensitive to vagal manuevers. Increase hydration. Follow up if continues to symptoms worsen.

## 2013-11-24 ENCOUNTER — Other Ambulatory Visit: Payer: Self-pay | Admitting: Physician Assistant

## 2013-12-03 ENCOUNTER — Ambulatory Visit (INDEPENDENT_AMBULATORY_CARE_PROVIDER_SITE_OTHER): Payer: BC Managed Care – PPO | Admitting: Family Medicine

## 2013-12-03 ENCOUNTER — Encounter: Payer: Self-pay | Admitting: Family Medicine

## 2013-12-03 ENCOUNTER — Telehealth: Payer: Self-pay | Admitting: *Deleted

## 2013-12-03 VITALS — BP 91/54 | HR 66 | Ht 65.0 in | Wt 141.0 lb

## 2013-12-03 DIAGNOSIS — R002 Palpitations: Secondary | ICD-10-CM

## 2013-12-03 DIAGNOSIS — L659 Nonscarring hair loss, unspecified: Secondary | ICD-10-CM

## 2013-12-03 DIAGNOSIS — N920 Excessive and frequent menstruation with regular cycle: Secondary | ICD-10-CM

## 2013-12-03 DIAGNOSIS — N921 Excessive and frequent menstruation with irregular cycle: Secondary | ICD-10-CM

## 2013-12-03 LAB — FERRITIN: Ferritin: 31 ng/mL (ref 10–291)

## 2013-12-03 LAB — CBC WITH DIFFERENTIAL/PLATELET
Basophils Absolute: 0.1 10*3/uL (ref 0.0–0.1)
Basophils Relative: 1 % (ref 0–1)
Eosinophils Absolute: 0.2 10*3/uL (ref 0.0–0.7)
Eosinophils Relative: 3 % (ref 0–5)
HCT: 41.8 % (ref 36.0–46.0)
Hemoglobin: 13.9 g/dL (ref 12.0–15.0)
Lymphocytes Relative: 29 % (ref 12–46)
Lymphs Abs: 1.7 10*3/uL (ref 0.7–4.0)
MCH: 29.8 pg (ref 26.0–34.0)
MCHC: 33.3 g/dL (ref 30.0–36.0)
MCV: 89.5 fL (ref 78.0–100.0)
Monocytes Absolute: 0.5 10*3/uL (ref 0.1–1.0)
Monocytes Relative: 8 % (ref 3–12)
Neutro Abs: 3.5 10*3/uL (ref 1.7–7.7)
Neutrophils Relative %: 59 % (ref 43–77)
Platelets: 334 10*3/uL (ref 150–400)
RBC: 4.67 MIL/uL (ref 3.87–5.11)
RDW: 13.2 % (ref 11.5–15.5)
WBC: 6 10*3/uL (ref 4.0–10.5)

## 2013-12-03 LAB — COMPLETE METABOLIC PANEL WITH GFR
ALT: 9 U/L (ref 0–35)
AST: 19 U/L (ref 0–37)
Albumin: 4 g/dL (ref 3.5–5.2)
Alkaline Phosphatase: 67 U/L (ref 39–117)
BUN: 9 mg/dL (ref 6–23)
CO2: 27 mEq/L (ref 19–32)
Calcium: 8.7 mg/dL (ref 8.4–10.5)
Chloride: 100 mEq/L (ref 96–112)
Creat: 0.62 mg/dL (ref 0.50–1.10)
GFR, Est African American: >89 mL/min
GFR, Est Non African American: >89 mL/min
Glucose, Bld: 80 mg/dL (ref 70–99)
Potassium: 4.2 mEq/L (ref 3.5–5.3)
Sodium: 136 mEq/L (ref 135–145)
Total Bilirubin: 0.2 mg/dL (ref 0.2–1.2)
Total Protein: 6.9 g/dL (ref 6.0–8.3)

## 2013-12-03 LAB — IRON: Iron: 50 ug/dL (ref 42–145)

## 2013-12-03 LAB — MAGNESIUM: Magnesium: 2.3 mg/dL (ref 1.5–2.5)

## 2013-12-03 LAB — VITAMIN B12: Vitamin B-12: 846 pg/mL (ref 211–911)

## 2013-12-03 LAB — TSH: TSH: 1.801 u[IU]/mL (ref 0.350–4.500)

## 2013-12-03 MED ORDER — TRAZODONE HCL 50 MG PO TABS
25.0000 mg | ORAL_TABLET | Freq: Every day | ORAL | Status: DC
Start: 2013-12-03 — End: 2015-02-23

## 2013-12-03 NOTE — Telephone Encounter (Signed)
Pt informed.[name]  

## 2013-12-03 NOTE — Telephone Encounter (Signed)
Pt called and would like Dr. Madilyn Fireman to write Rx for Restoril.Audelia Hives [place]

## 2013-12-03 NOTE — Telephone Encounter (Signed)
We have never filled that here before. I really don't like use benzodiazepines for sleep. That is no longer considered standard of care. I will send her a prescription for trazodone but if she is not doing well or having problems then recommend schedule a appointment to discuss specifically insomnia.

## 2013-12-03 NOTE — Progress Notes (Signed)
5  Subjective:    Patient ID: [name], female    DOB: [birth year], 43 y.o.   MRN: [id number]  HPI  Hair loss x 3 weeks and heart palpitation with burst of energy.  difficulty sleeping. Waking up frequently at night. + Dry skin. Hx of irregular periods since had her tubes tied.   Has been having breakthrough periods,  Feel liks he has lost 50% of hair.  Hx of ablation for arrhythmia.  No worsening or alleviating factors.  She doesn't have any rash on the scalp and is not losing hair in patches. She seems to be generalized. She says it's coming out with the root attachment. There is no hairs themselves. No new changes in chemicals etc. on the hair. No vision changes in diet etc. Lab Results  Component Value Date   TSH 1.163 08/11/2013     Review of Systems NO CP or SOB.  No fever, chills, or sweats.  No abdominal pain or changes in bowels. She does have a prior history of iron deficiency anemia. She says she's also been told that her potassium has been off several times in the past.  BP 91/54  Pulse 66  Ht 5\' 5"  (1.651 m)  Wt 141 lb (63.957 kg)  BMI 23.46 kg/m2    Allergies  Allergen Reactions  . Demerol Nausea And Vomiting and Swelling  . Meperidine Hcl     Past Medical History  Diagnosis Date  . Atrial tachycardia   . Ovarian cyst     Past Surgical History  Procedure Laterality Date  . Ablasion    . Tubal ligation      History   Social History  . Marital Status: Divorced    Spouse Name: N/A    Number of Children: N/A  . Years of Education: N/A   Occupational History  . Not on file.   Social History Main Topics  . Smoking status: Never Smoker   . Smokeless tobacco: Not on file  . Alcohol Use: Yes  . Drug Use: No  . Sexual Activity:    Other Topics Concern  . Not on file   Social History Narrative  . No narrative on file    No family history on file.  Outpatient Encounter Prescriptions as of 12/03/2013  Medication Sig  . BIOTIN PO Take by  mouth daily.  . Cyanocobalamin (B-12 PO) Take by mouth.  . Linoleic Acid Conjugated (CLA PO) Take by mouth.  . valACYclovir (VALTREX) 1000 MG tablet TAKE 1 TABLET BY MOUTH TWICE DAILY  . [DISCONTINUED] methylPREDNIsolone (MEDROL DOSPACK) 4 MG tablet follow package directions          Objective:   Physical Exam  Constitutional: She is oriented to person, place, and time. She appears well-developed and well-nourished.  HENT:  Head: Normocephalic and atraumatic.  Neck: Neck supple. No thyromegaly present.  Cardiovascular: Normal rate, regular rhythm and normal heart sounds.   Pulmonary/Chest: Effort normal and breath sounds normal.  Musculoskeletal: She exhibits no edema.  Lymphadenopathy:    She has no cervical adenopathy.  Neurological: She is alert and oriented to person, place, and time.  Skin: Skin is warm and dry.  Psychiatric: She has a normal mood and affect. Her behavior is normal.          Assessment & Plan:  Hair loss-unclear etiology. Appears to be diffuse. There are no patches of hair loss. We'll recheck her thyroid as well as B12, iron stores et Ronney Asters. We'll also  check a c.  Palpitations-unclear etiology. Did not repeat an EKG today she's not currently symptomatic. But she does have a history of intraseptal defect has also had a history of supraventricular tachycardia that has required ablation twice. She said she's done well until this last week when she started noticing palpitations again. We will check hormone levels as well as electrolytes etc. and get back with her.  As far as her frequent abnormal. Ciardi has an appointment scheduled with her GYN in August for possible endometrial biopsy.

## 2013-12-04 LAB — LUTEINIZING HORMONE: LH: 35 m[IU]/mL

## 2013-12-04 LAB — ESTRADIOL: Estradiol: 334.6 pg/mL

## 2013-12-04 LAB — FOLLICLE STIMULATING HORMONE: FSH: 7 m[IU]/mL

## 2013-12-04 LAB — PROGESTERONE: Progesterone: 0.2 ng/mL

## 2013-12-06 LAB — TESTOSTERONE, FREE, TOTAL, SHBG
Sex Hormone Binding: 68 nmol/L (ref 18–114)
Testosterone, Free: 3.9 pg/mL (ref 0.6–6.8)
Testosterone-% Free: 1.1 % (ref 0.4–2.4)
Testosterone: 35 ng/dL (ref 10–70)

## 2013-12-15 ENCOUNTER — Telehealth: Payer: Self-pay

## 2013-12-15 ENCOUNTER — Other Ambulatory Visit: Payer: Self-pay | Admitting: Family Medicine

## 2013-12-15 MED ORDER — AMBULATORY NON FORMULARY MEDICATION: Status: DC

## 2013-12-15 NOTE — Telephone Encounter (Signed)
Patient is very stressed and is having a lot of anxiety. She was wanting some medication to help her with her anxiety.

## 2013-12-16 MED ORDER — SERTRALINE HCL 50 MG PO TABS: ORAL_TABLET | ORAL | Status: DC

## 2013-12-16 NOTE — Telephone Encounter (Signed)
rx sent. Needs to f/u in 3 weeks so we can asses the medication

## 2013-12-16 NOTE — Telephone Encounter (Signed)
Left detailed message.   

## 2013-12-21 ENCOUNTER — Telehealth: Payer: Self-pay | Admitting: *Deleted

## 2013-12-21 DIAGNOSIS — L659 Nonscarring hair loss, unspecified: Secondary | ICD-10-CM

## 2013-12-21 NOTE — Telephone Encounter (Signed)
Pt called and stated that she will need a referral to endocrinology. She will be seeing Dr. Steffanie Dunn.Audelia Hives [place]

## 2013-12-22 ENCOUNTER — Telehealth: Payer: Self-pay

## 2013-12-22 NOTE — Telephone Encounter (Signed)
Pt saw Dr. Madilyn Fireman on July 17 for hair loss. She states she is loosing between 160 to 170 strands of hair a day and would like to know if we can order a blood test called " blood and hair workup". Please Advise./Arie Gable,CMA

## 2013-12-23 ENCOUNTER — Telehealth: Payer: Self-pay | Admitting: Family Medicine

## 2013-12-23 DIAGNOSIS — L659 Nonscarring hair loss, unspecified: Secondary | ICD-10-CM

## 2013-12-23 NOTE — Telephone Encounter (Signed)
Dr. Steffanie Dunn felt that she should see dermatology.Elouise Munroe Referral placed.[name] [place]

## 2013-12-23 NOTE — Telephone Encounter (Signed)
Endocrinologist thinks patient will benefit more by seeing an Gastroenterologist.  He does not think he will able to add anything or help her since she has had a thorough lab work up and all labs were normal.

## 2013-12-24 NOTE — Telephone Encounter (Signed)
Pt informed./[name],CMA 

## 2013-12-24 NOTE — Telephone Encounter (Signed)
I am sending to [facility] to see if know what this lab is or what we could order.

## 2013-12-27 NOTE — Telephone Encounter (Signed)
There is no such panel. It is probably bc increases in stress.  She was also found to have low iron and we just started treatment for this. This certainly could be contributing but it will take a couple months of therapy to notice if the hair loss is changing or improving.

## 2014-01-18 ENCOUNTER — Other Ambulatory Visit: Payer: Self-pay | Admitting: *Deleted

## 2014-01-18 DIAGNOSIS — L659 Nonscarring hair loss, unspecified: Secondary | ICD-10-CM

## 2014-03-01 ENCOUNTER — Other Ambulatory Visit: Payer: Self-pay

## 2014-03-01 DIAGNOSIS — Z1239 Encounter for other screening for malignant neoplasm of breast: Secondary | ICD-10-CM

## 2014-03-09 ENCOUNTER — Other Ambulatory Visit: Payer: Self-pay

## 2014-03-09 DIAGNOSIS — Z1231 Encounter for screening mammogram for malignant neoplasm of breast: Secondary | ICD-10-CM

## 2014-03-11 ENCOUNTER — Ambulatory Visit
Admission: RE | Admit: 2014-03-11 | Discharge: 2014-03-11 | Disposition: A | Discharge status: Home / Self Care | Payer: BC Managed Care – PPO | Source: Ambulatory Visit

## 2014-03-11 DIAGNOSIS — Z1231 Encounter for screening mammogram for malignant neoplasm of breast: Secondary | ICD-10-CM

## 2014-06-06 ENCOUNTER — Other Ambulatory Visit: Payer: Self-pay | Admitting: Physician Assistant

## 2014-08-29 ENCOUNTER — Ambulatory Visit: Payer: Self-pay | Admitting: Physician Assistant

## 2015-02-13 ENCOUNTER — Other Ambulatory Visit: Payer: Self-pay

## 2015-02-13 DIAGNOSIS — Z1231 Encounter for screening mammogram for malignant neoplasm of breast: Secondary | ICD-10-CM

## 2015-02-23 ENCOUNTER — Ambulatory Visit (INDEPENDENT_AMBULATORY_CARE_PROVIDER_SITE_OTHER): Payer: Managed Care, Other (non HMO) | Admitting: Family Medicine

## 2015-02-23 ENCOUNTER — Encounter: Payer: Self-pay | Admitting: Family Medicine

## 2015-02-23 VITALS — BP 124/56 | HR 72 | Wt 139.0 lb

## 2015-02-23 DIAGNOSIS — F418 Other specified anxiety disorders: Secondary | ICD-10-CM | POA: Insufficient documentation

## 2015-02-23 MED ORDER — VENLAFAXINE HCL ER 37.5 MG PO CP24: 37.5000 mg | ORAL_CAPSULE | Freq: Every day | ORAL | Status: DC

## 2015-02-23 MED ORDER — TEMAZEPAM 15 MG PO CAPS: 15.0000 mg | ORAL_CAPSULE | Freq: Every evening | ORAL | Status: DC | PRN

## 2015-02-23 NOTE — Assessment & Plan Note (Signed)
Treatment with Effexor, Restoril, and counseling. Recheck in 1-2 weeks. Eventually transition care to PCP.

## 2015-02-23 NOTE — Patient Instructions (Signed)
Thank you for coming in today. Take effexor daily,  Use restoril at night as needed.  Avoid becoming pregnant on these medicines.  Return in 1-2 weeks.  Call 911 or go to the ER if you feel like hurting yourself or others.  Follow up with therapy/counseling.  Major Depressive Disorder Major depressive disorder is a mental illness. It also may be called clinical depression or unipolar depression. Major depressive disorder usually causes feelings of sadness, hopelessness, or helplessness. Some people with this disorder do not feel particularly sad but lose interest in doing things they used to enjoy (anhedonia). Major depressive disorder also can cause physical symptoms. It can interfere with work, school, relationships, and other normal everyday activities. The disorder varies in severity but is longer lasting and more serious than the sadness we all feel from time to time in our lives. Major depressive disorder often is triggered by stressful life events or major life changes. Examples of these triggers include divorce, loss of your job or home, a move, and the death of a family member or close friend. Sometimes this disorder occurs for no obvious reason at all. People who have family members with major depressive disorder or bipolar disorder are at higher risk for developing this disorder, with or without life stressors. Major depressive disorder can occur at any age. It may occur just once in your life (single episode major depressive disorder). It may occur multiple times (recurrent major depressive disorder). SYMPTOMS People with major depressive disorder have either anhedonia or depressed mood on nearly a daily basis for at least 2 weeks or longer. Symptoms of depressed mood include:  Feelings of sadness (blue or down in the dumps) or emptiness.  Feelings of hopelessness or helplessness.  Tearfulness or episodes of crying (may be observed by others).  Irritability (children and  adolescents). In addition to depressed mood or anhedonia or both, people with this disorder have at least four of the following symptoms:  Difficulty sleeping or sleeping too much.   Significant change (increase or decrease) in appetite or weight.   Lack of energy or motivation.  Feelings of guilt and worthlessness.   Difficulty concentrating, remembering, or making decisions.  Unusually slow movement (psychomotor retardation) or restlessness (as observed by others).   Recurrent wishes for death, recurrent thoughts of self-harm (suicide), or a suicide attempt. People with major depressive disorder commonly have persistent negative thoughts about themselves, other people, and the world. People with severe major depressive disorder may experiencedistorted beliefs or perceptions about the world (psychotic delusions). They also may see or hear things that are not real (psychotic hallucinations). DIAGNOSIS Major depressive disorder is diagnosed through an assessment by your health care provider. Your health care provider will ask aboutaspects of your daily life, such as mood,sleep, and appetite, to see if you have the diagnostic symptoms of major depressive disorder. Your health care provider may ask about your medical history and use of alcohol or drugs, including prescription medicines. Your health care provider also may do a physical exam and blood work. This is because certain medical conditions and the use of certain substances can cause major depressive disorder-like symptoms (secondary depression). Your health care provider also may refer you to a mental health specialist for further evaluation and treatment. TREATMENT It is important to recognize the symptoms of major depressive disorder and seek treatment. The following treatments can be prescribed for this disorder:   Medicine. Antidepressant medicines usually are prescribed. Antidepressant medicines are thought to correct chemical  imbalances  in the brain that are commonly associated with major depressive disorder. Other types of medicine may be added if the symptoms do not respond to antidepressant medicines alone or if psychotic delusions or hallucinations occur.  Talk therapy. Talk therapy can be helpful in treating major depressive disorder by providing support, education, and guidance. Certain types of talk therapy also can help with negative thinking (cognitive behavioral therapy) and with relationship issues that trigger this disorder (interpersonal therapy). A mental health specialist can help determine which treatment is best for you. Most people with major depressive disorder do well with a combination of medicine and talk therapy. Treatments involving electrical stimulation of the brain can be used in situations with extremely severe symptoms or when medicine and talk therapy do not work over time. These treatments include electroconvulsive therapy, transcranial magnetic stimulation, and vagal nerve stimulation.   This information is not intended to replace advice given to you by your health care provider. Make sure you discuss any questions you have with your health care provider.   Document Released: 08/31/2012 Document Revised: 05/27/2014 Document Reviewed: 08/31/2012 Elsevier Interactive Patient Education [employer].

## 2015-02-23 NOTE — Progress Notes (Signed)
[name] is a 44 y.o. female who presents to [facility]  today for exam and depression. Patient has some challenges in her personal and financial life. She became married a few months ago and has combine households. 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 She feels ambivalent about having to move away from [place] to [place]. Additionally she notes that her husband's ex-girlfriend's daughter (not his daughter) has been living with him for some time now. There are some interpersonal struggles that make this a challenging relationship. She notes that she gets along with the other stepchildren without difficulty. She is not expected to be living with this child has she just moved and without asking. Additionally her visits his family. She notes in the past she's had anxiety and depression and was treated with Zoloft and trazodone which is not helped much. She has had counseling in the past. She was not very tolerant of Zoloft because of the sexual side effects.   Past Medical History  Diagnosis Date  . Atrial tachycardia ([place])   . Ovarian cyst    Past Surgical History  Procedure Laterality Date  . Ablasion    . Tubal ligation     Social History  Substance Use Topics  . Smoking status: Never Smoker   . Smokeless tobacco: Not on file  . Alcohol Use: Yes   family history is not on file.  ROS as above Medications: Current Outpatient Prescriptions  Medication Sig Dispense Refill  . AMBULATORY NON FORMULARY MEDICATION Medication Name: Progesterone 25 mg/ml cream.  68ml topical QD 30 mL 2  . BIOTIN PO Take by mouth daily.    . Cyanocobalamin (B-12 PO) Take by mouth.    . venlafaxine XR (EFFEXOR XR) 37.5 MG 24 hr capsule Take 1 capsule (37.5 mg total) by mouth daily with breakfast. 30 capsule 0   No current facility-administered medications for this visit.   Allergies  Allergen Reactions  . Demerol Nausea And Vomiting and  Swelling  . Meperidine Hcl      Exam:  BP 124/56 mmHg  Pulse 72  Wt 139 lb (63.05 kg) Gen: Well NAD HEENT: EOMI,  MMM Lungs: Normal work of breathing. CTABL Heart: RRR no MRG Abd: NABS, Soft. Nondistended, Nontender Exts: Brisk capillary refill, warm and well perfused.  Psych: Alert and oriented normal affect normal speech and thought process. PHQ9 is significantly elevated at 23 without suicidality. GAD 7 is also significantly elevated at 26.  No results found for this or any previous visit (from the past 24 hour(s)). No results found.   Please see individual assessment and plan sections.

## 2015-03-16 ENCOUNTER — Ambulatory Visit: Payer: Managed Care, Other (non HMO) | Admitting: Family Medicine

## 2015-03-17 ENCOUNTER — Ambulatory Visit: Payer: Self-pay

## 2015-06-26 ENCOUNTER — Encounter: Payer: Self-pay | Admitting: Family Medicine

## 2015-06-26 ENCOUNTER — Ambulatory Visit (INDEPENDENT_AMBULATORY_CARE_PROVIDER_SITE_OTHER): Payer: Managed Care, Other (non HMO) | Admitting: Family Medicine

## 2015-06-26 VITALS — BP 112/69 | HR 76 | Temp 98.9°F | Wt 137.9 lb

## 2015-06-26 DIAGNOSIS — J101 Influenza due to other identified influenza virus with other respiratory manifestations: Secondary | ICD-10-CM

## 2015-06-26 DIAGNOSIS — R52 Pain, unspecified: Secondary | ICD-10-CM | POA: Diagnosis not present

## 2015-06-26 LAB — POCT INFLUENZA A/B
Influenza A, POC: NEGATIVE
Influenza B, POC: POSITIVE — AB

## 2015-06-26 MED ORDER — HYDROCODONE-HOMATROPINE 5-1.5 MG/5ML PO SYRP: 5.0000 mL | ORAL_SOLUTION | Freq: Every evening | ORAL | Status: DC | PRN

## 2015-06-26 MED ORDER — OSELTAMIVIR PHOSPHATE 75 MG PO CAPS
75.0000 mg | ORAL_CAPSULE | Freq: Every day | ORAL | Status: DC
Start: 2015-06-26 — End: 2015-07-05

## 2015-06-26 NOTE — Progress Notes (Signed)
   Subjective:    Patient ID: [name], female    DOB: [birth year], 45 y.o.   MRN: [id number]  HPI C/O cough, runny nose, bodyaches and her skin hurts 4 days.  No N/V/D.  No fever.  + sick contacts.  Using dayqual and Advil prn. - helps some  +sweats.  Burning in her throat.  SOB.  2 of her children and husband also had a cough but they did not feel very sick and they are actually getting better.   Review of Systems     Objective:   Physical Exam  Constitutional: She is oriented to person, place, and time. She appears well-developed and well-nourished.  HENT:  Head: Normocephalic and atraumatic.  Right Ear: External ear normal.  Left Ear: External ear normal.  Nose: Nose normal.  Mouth/Throat: Oropharynx is clear and moist.  TMs and canals are clear.   Eyes: Conjunctivae and EOM are normal. Pupils are equal, round, and reactive to light.  Neck: Neck supple. No thyromegaly present.  Cardiovascular: Normal rate, regular rhythm and normal heart sounds.   Pulmonary/Chest: Effort normal and breath sounds normal. She has no wheezes.  Lymphadenopathy:    She has no cervical adenopathy.  Neurological: She is alert and oriented to person, place, and time.  Skin: Skin is warm and dry.  Psychiatric: She has a normal mood and affect.          Assessment & Plan:  Influenza - positive for influenza B. Recommend symptomatic care. She is out of the window for Tamiflu. Call if not improving by the end of the week.

## 2015-06-26 NOTE — Patient Instructions (Signed)

## 2015-06-29 ENCOUNTER — Ambulatory Visit
Admission: RE | Admit: 2015-06-29 | Discharge: 2015-06-29 | Disposition: A | Discharge status: Home / Self Care | Payer: Managed Care, Other (non HMO) | Source: Ambulatory Visit

## 2015-06-29 DIAGNOSIS — Z1231 Encounter for screening mammogram for malignant neoplasm of breast: Secondary | ICD-10-CM

## 2015-07-05 ENCOUNTER — Encounter: Payer: Self-pay | Admitting: Physician Assistant

## 2015-07-05 ENCOUNTER — Ambulatory Visit (INDEPENDENT_AMBULATORY_CARE_PROVIDER_SITE_OTHER): Payer: Managed Care, Other (non HMO) | Admitting: Physician Assistant

## 2015-07-05 VITALS — BP 104/63 | HR 66 | Temp 98.6°F | Ht 65.0 in | Wt 138.0 lb

## 2015-07-05 DIAGNOSIS — H6592 Unspecified nonsuppurative otitis media, left ear: Secondary | ICD-10-CM

## 2015-07-05 DIAGNOSIS — H6692 Otitis media, unspecified, left ear: Secondary | ICD-10-CM | POA: Diagnosis not present

## 2015-07-05 DIAGNOSIS — R05 Cough: Secondary | ICD-10-CM

## 2015-07-05 DIAGNOSIS — R059 Cough, unspecified: Secondary | ICD-10-CM

## 2015-07-05 MED ORDER — ALBUTEROL SULFATE HFA 108 (90 BASE) MCG/ACT IN AERS: 2.0000 | INHALATION_SPRAY | Freq: Four times a day (QID) | RESPIRATORY_TRACT | Status: DC | PRN

## 2015-07-05 MED ORDER — IPRATROPIUM-ALBUTEROL 0.5-2.5 (3) MG/3ML IN SOLN
3.0000 mL | Freq: Four times a day (QID) | RESPIRATORY_TRACT | Status: DC
  Administered 2015-07-05: 3 mL via RESPIRATORY_TRACT

## 2015-07-05 MED ORDER — LEVOFLOXACIN 750 MG PO TABS: 750.0000 mg | ORAL_TABLET | Freq: Every day | ORAL | Status: DC

## 2015-07-05 MED ORDER — PREDNISONE 20 MG PO TABS: ORAL_TABLET | ORAL | Status: DC

## 2015-07-05 NOTE — Progress Notes (Addendum)
   Subjective:    Patient ID: [name], female    DOB: 1970/07/31, 45 y.o.   MRN: [id number]  HPI  Patient is a 45 year old female that presents to the office with cough productive of green sputum, throat "burning," left ear pain, and shortness of breath. Patient has a past medial history relevent for recurrent middle ear infections and exercise induced asthma. Patient states that she was diagnosed with the Influenza B on 06/26/2015. Patient states that headaches and body aches improved last week. However, on 07/03/2015, patient started to have ear pain, fatigue, throat burning and cough with productive sputum. Patient states that she has had some diarrhea and diminished appetite. Patient denies hematuria or dysuria. Patient states that her last menstrual cycle was at the end of January.  Patient has taken Mucinex and Advil, which has not alleviated her symptoms. Patient has been afebrile.    Review of Systems    Please see HPI Objective:   Physical Exam  Constitutional: She is oriented to person, place, and time. She appears well-developed and well-nourished.  HENT:  Head: Normocephalic and atraumatic.  Nose: Nose normal.  Mouth/Throat: Oropharynx is clear and moist. No oropharyngeal exudate.  Patient's right TM is severely sclerosed, patient states that she has had numerous middle ear infections in the right ear. Patient's left ear is erythematous, bulging and with some associated pus.   Eyes: Conjunctivae are normal. Pupils are equal, round, and reactive to light. Right eye exhibits no discharge. Left eye exhibits no discharge.  Neck: Normal range of motion. No thyromegaly present.  Cardiovascular: Normal rate, regular rhythm, normal heart sounds and intact distal pulses.  Exam reveals no friction rub.   No murmur heard. Pulmonary/Chest:  Patient experiences profound dizziness with auscultation of the lungs. Patient's lungs were clear to auscultation initially. However, after duo neb  treatment, inspiratory crackles were auscultated at the lung bases bilaterally.   Musculoskeletal: Normal range of motion. She exhibits no edema.  Lymphadenopathy:    She has no cervical adenopathy.  Neurological: She is alert and oriented to person, place, and time.  Skin: Skin is warm and dry.  Psychiatric: She has a normal mood and affect. Her behavior is normal. Judgment and thought content normal.       Assessment & Plan:   1. Cough, strong suspicion for pneumonia. Differential diagnosis includes acute bronchitis and pneumonia. Patient has shortness of breath, productive cough for purulent sputum, and fatigue. Differential diagnosis includes acute bronchitis and pneumonia. Patient has been afebrile without tachycardia or tachypnea, which decreases index of suspicion for pneumonia. However, inspiratory crackles were auscultated after in office duo neb treatment. Patient will be treated with levaquin for 7 days. As indicated, duo neb was given in office. Patient was advised to follow up if symptoms persist. Patient was prescribed prednisone and advised that if she still experiences persistent chest tightness and shortness of breath by Friday, to start prednisone.  2. Left Acute Otitis Media Patient has experienced left ear pain. Patient's left TM is erythematous with associated pus. Patient will be treated with Levaquin as mentioned above.

## 2015-07-05 NOTE — Addendum Note (Signed)
Addended by: Donella Stade on: 07/05/2015 03:50 PM   Modules accepted: Orders

## 2015-07-05 NOTE — Patient Instructions (Signed)

## 2015-10-25 DIAGNOSIS — Q211 Atrial septal defect: Secondary | ICD-10-CM | POA: Insufficient documentation

## 2015-10-25 DIAGNOSIS — Q2112 Patent foramen ovale: Secondary | ICD-10-CM | POA: Insufficient documentation

## 2016-02-20 ENCOUNTER — Ambulatory Visit: Payer: Managed Care, Other (non HMO) | Admitting: Sports Medicine

## 2016-04-01 ENCOUNTER — Ambulatory Visit (INDEPENDENT_AMBULATORY_CARE_PROVIDER_SITE_OTHER): Payer: Managed Care, Other (non HMO) | Admitting: Family Medicine

## 2016-04-01 ENCOUNTER — Encounter: Payer: Self-pay | Admitting: Family Medicine

## 2016-04-01 VITALS — BP 124/84 | HR 90 | Ht 65.0 in | Wt 138.0 lb

## 2016-04-01 DIAGNOSIS — R600 Localized edema: Secondary | ICD-10-CM | POA: Diagnosis not present

## 2016-04-01 DIAGNOSIS — D72829 Elevated white blood cell count, unspecified: Secondary | ICD-10-CM | POA: Diagnosis not present

## 2016-04-01 DIAGNOSIS — R5383 Other fatigue: Secondary | ICD-10-CM

## 2016-04-01 DIAGNOSIS — E2740 Unspecified adrenocortical insufficiency: Secondary | ICD-10-CM

## 2016-04-01 DIAGNOSIS — R7989 Other specified abnormal findings of blood chemistry: Secondary | ICD-10-CM

## 2016-04-01 DIAGNOSIS — J029 Acute pharyngitis, unspecified: Secondary | ICD-10-CM

## 2016-04-01 LAB — POCT URINALYSIS DIPSTICK
Bilirubin, UA: NEGATIVE
Blood, UA: NEGATIVE
Glucose, UA: NEGATIVE
Ketones, UA: NEGATIVE
Leukocytes, UA: NEGATIVE
Nitrite, UA: NEGATIVE
Protein, UA: NEGATIVE
Spec Grav, UA: 1.015
Urobilinogen, UA: 0.2
pH, UA: 8

## 2016-04-01 NOTE — Progress Notes (Signed)
Subjective:    CC:   HPI:  Patient started Endurax T3 supplement on 9/13. After couple of weeks been on the medication she was expressing palpitations, sweating, feeling flushed in her face, and her hands feeling hot. She had increased hunger as well. She finally stopped the medication. She says it took a couple weeks for the symptoms to start to resolve. Then she started noticing facial swelling, dry mouth, fullness and abdominal pain around the 2nd week of October.  . She has had red splotches on her throat and mouth and has felt achy.  When at ED told her WBC is 20000. Was told her thyroid was fine and told her A1C was OK as well.  3 days ago noticed her tongue looked white.  He just feels extremely tired and has had some bilateral mid back pain. She's also most concerned because her jaw is swollen. Even people/friends have commented to her that she doesn't look normal. She does have a long-standing history of dry eyes and uses drops for that.  Past medical history, Surgical history, Family history not pertinant except as noted below, Social history, Allergies, and medications have been entered into the medical record, reviewed, and corrections made.   Review of Systems: No fevers, chills, night sweats, weight loss, chest pain, or shortness of breath.   Objective:    General: Well Developed, well nourished, and in no acute distress.  Neuro: Alert and oriented x3, extra-ocular muscles intact, sensation grossly intact.  HEENT: Normocephalic, atraumatic, OP  with scattered erythematous areas. No distinct papules or nodules. It almost looks reticular. She has a couple areas affected on the inner cheek mucosal lining. No actual lesions on the tongue or underneath the time. No gum abnormalities. No significant cervical lymphadenopathy. I'm unable to palpate any distinct salivary glands. She does have some fullness around the jawline bilaterally.  Skin: Warm and dry, no rashes. Cardiac: Regular rate  and rhythm, no murmurs rubs or gallops, no lower extremity edema.  Respiratory: Clear to auscultation bilaterally. Not using accessory muscles, speaking in full sentences. Abd: soft, normal bowel sounds, mildly tender suprapubically.   Impression and Recommendations:   Fatigue-  Unclear etiology. I suspect something viral though white blood cell count was quite high. I'm also concerned about the rash in her mouth. Did a throat culture today. She did have a little bit of discomfort suprapubically but urinalysis was negative. Will repeat CBC today. Also check for inflammatory markers. Sjogren's is certainly possibilities.   Polydipsia-Also consider diabetes insipidus with the increased thirst.  Palpitations -seems to have resolved off of the supplemental T3.  Dry mouth-she has a history of chronic dry eye. Consider Sjogren's.

## 2016-04-03 LAB — COMPLETE METABOLIC PANEL WITH GFR
ALT: 11 U/L (ref 6–29)
AST: 17 U/L (ref 10–30)
Albumin: 3.9 g/dL (ref 3.6–5.1)
Alkaline Phosphatase: 54 U/L (ref 33–115)
BUN: 11 mg/dL (ref 7–25)
CO2: 27 mmol/L (ref 20–31)
Calcium: 9 mg/dL (ref 8.6–10.2)
Chloride: 102 mmol/L (ref 98–110)
Creat: 0.71 mg/dL (ref 0.50–1.10)
GFR, Est African American: >89 mL/min (ref >=60)
GFR, Est Non African American: >89 mL/min (ref >=60)
Glucose, Bld: 71 mg/dL (ref 65–99)
Potassium: 3.9 mmol/L (ref 3.5–5.3)
Sodium: 138 mmol/L (ref 135–146)
Total Bilirubin: 0.4 mg/dL (ref 0.2–1.2)
Total Protein: 6.6 g/dL (ref 6.1–8.1)

## 2016-04-03 LAB — CBC WITH DIFFERENTIAL/PLATELET
Basophils Absolute: 75 cells/uL (ref 0–200)
Basophils Relative: 1 %
Eosinophils Absolute: 150 cells/uL (ref 15–500)
Eosinophils Relative: 2 %
HCT: 41.8 % (ref 35.0–45.0)
Hemoglobin: 14.2 g/dL (ref 11.7–15.5)
Lymphocytes Relative: 30 %
Lymphs Abs: 2250 cells/uL (ref 850–3900)
MCH: 31.3 pg (ref 27.0–33.0)
MCHC: 34 g/dL (ref 32.0–36.0)
MCV: 92.1 fL (ref 80.0–100.0)
MPV: 9.5 fL (ref 7.5–12.5)
Monocytes Absolute: 600 cells/uL (ref 200–950)
Monocytes Relative: 8 %
Neutro Abs: 4425 cells/uL (ref 1500–7800)
Neutrophils Relative %: 59 %
Platelets: 298 10*3/uL (ref 140–400)
RBC: 4.54 MIL/uL (ref 3.80–5.10)
RDW: 14.7 % (ref 11.0–15.0)
WBC: 7.5 10*3/uL (ref 3.8–10.8)

## 2016-04-03 LAB — EPSTEIN-BARR VIRUS VCA, IGM: EBV VCA IgM: <36 U/mL

## 2016-04-03 LAB — SEDIMENTATION RATE: Sed Rate: 10 mm/hr (ref 0–20)

## 2016-04-03 LAB — CORTISOL: Cortisol, Plasma: <0.5 ug/dL — ABNORMAL LOW

## 2016-04-03 LAB — EPSTEIN-BARR VIRUS VCA, IGG: EBV VCA IgG: 109 U/mL — ABNORMAL HIGH

## 2016-04-03 LAB — OSMOLALITY, URINE: Osmolality, Ur: 357 mOsm/kg (ref 50–1200)

## 2016-04-03 LAB — OSMOLALITY: Osmolality: 282 mOsm/kg (ref 278–305)

## 2016-04-03 LAB — C-REACTIVE PROTEIN: CRP: 0.5 mg/L (ref <8.0)

## 2016-04-04 ENCOUNTER — Other Ambulatory Visit: Payer: Self-pay | Admitting: Family Medicine

## 2016-04-04 LAB — CULTURE, GROUP A STREP: Organism ID, Bacteria: NORMAL

## 2016-04-04 NOTE — Addendum Note (Signed)
Addended by: Teddy Spike on: 04/04/2016 04:06 PM   Modules accepted: Orders

## 2016-04-05 LAB — CMV IGM: CMV IgM: <30 AU/mL (ref <30.00)

## 2016-04-06 LAB — CORTISOL-AM, BLOOD: Cortisol - AM: <0.5 ug/dL — ABNORMAL LOW

## 2016-04-08 ENCOUNTER — Other Ambulatory Visit: Payer: Self-pay

## 2016-04-08 DIAGNOSIS — E2749 Other adrenocortical insufficiency: Secondary | ICD-10-CM

## 2016-04-08 LAB — ACTH: C206 ACTH: <5 pg/mL — ABNORMAL LOW (ref 6–50)

## 2016-04-08 NOTE — Addendum Note (Signed)
Addended by: [name] D on: 04/08/2016 12:33 PM   Modules accepted: Orders

## 2016-04-09 ENCOUNTER — Telehealth: Payer: Self-pay

## 2016-04-09 ENCOUNTER — Other Ambulatory Visit: Payer: Self-pay | Admitting: Family Medicine

## 2016-04-09 DIAGNOSIS — R7989 Other specified abnormal findings of blood chemistry: Secondary | ICD-10-CM

## 2016-04-09 DIAGNOSIS — E2740 Unspecified adrenocortical insufficiency: Secondary | ICD-10-CM

## 2016-04-09 LAB — TSH: TSH: 0.61 mIU/L

## 2016-04-09 NOTE — Telephone Encounter (Signed)
I called [facility] Address: [address], [place], [place] [address]  Phone: [phone] and asked if patient could be seen this week or early next week. They will call back after speaking with a provider.

## 2016-04-09 NOTE — Telephone Encounter (Signed)
Called [name] On 04/17/16 @ 2:00pm with Dr. Loanne Drilling. Pt. aware of apt.

## 2016-04-10 LAB — RSV(RESPIRATORY SYNCYTIAL VIRUS) AB, BLOOD: RSV Antibodies: 1:8 {titer} — ABNORMAL HIGH

## 2016-04-14 NOTE — Progress Notes (Signed)
Subjective:     Patient ID: [name], female   DOB: [birth year], 45 y.o.   MRN: [id number]  HPI  Pt is referred by Dr Madilyn Fireman, for hypocortisolism.   She has taken courses of steroids, but she says the last was in early 2017.  She has reg menses, until most recent period was missed.  Pt was noted to have low cortisol level.  no h/o abdominal or brain injury.  No h/o cancer, thyroid problems, seizures, hypoglycemia, amyloidosis, tuberculosis, or diabetes.  No h/o ketoconazole, rifampin, or dilantin. She has intermittent moderate dryness of the mouth, and assoc abd cramps.  She has had TL. Past Medical History:  Diagnosis Date  . Asthma    allergy, exercise induced  . Atrial tachycardia ([place])   . Decreased cortisol level ([place])   . Lethargic   . Ovarian cyst   . PONV (postoperative nausea and vomiting)     Past Surgical History:  Procedure Laterality Date  . ablasion    . ANTERIOR CRUCIATE LIGAMENT REPAIR Left   . BREAST SURGERY     augmentation  . CARDIAC ELECTROPHYSIOLOGY STUDY AND ABLATION     twice  . MYRINGOTOMY    . NASAL SEPTUM SURGERY    . TONSILLECTOMY AND ADENOIDECTOMY    . TUBAL LIGATION    . WISDOM TOOTH EXTRACTION      Social History   Social History  . Marital status: Divorced    Spouse name: N/A  . Number of children: N/A  . Years of education: N/A   Occupational History  . Not on file.   Social History Main Topics  . Smoking status: Never Smoker  . Smokeless tobacco: Never Used  . Alcohol use Yes  . Drug use: No  . Sexual activity: Not on file   Other Topics Concern  . Not on file   Social History Narrative  . No narrative on file    Current Outpatient Prescriptions on File Prior to Visit  Medication Sig Dispense Refill  . albuterol (PROVENTIL HFA;VENTOLIN HFA) 108 (90 Base) MCG/ACT inhaler Inhale 2 puffs into the lungs every 6 (six) hours as needed for wheezing or shortness of breath. (Patient not taking: Reported on 04/17/2016) 1 Inhaler  2   No current facility-administered medications on file prior to visit.     Allergies  Allergen Reactions  . Demerol Nausea And Vomiting and Swelling   No family history on file.  BP 130/80   Pulse 65   Wt 143 lb (64.9 kg)   LMP 03/21/2016   SpO2 96%   BMI 24.55 kg/m   Review of Systems Denies fever, headache, n/v, seizure, easy bruising, cold intolerance, anxiety, diarrhea, vitiligo, or change in skin tone.  denies polyuria, loss of smell, syncope, rash, depression, frequent urination, galactorrhea.  She has fatigue, muscle cramps, rhinorrhea, weight gain, blurry vision, palpitations, lightheadedness, doe, excessive diaphoresis, and swelling of the face.     Objective:   Physical Exam VS: see vs page GEN: no distress HEAD: head: no deformity eyes: no periorbital swelling, no proptosis external nose and ears are normal mouth: no lesion seen NECK: supple, thyroid is not enlarged CHEST WALL: no deformity LUNGS: clear to auscultation CV: reg rate and rhythm, no murmur ABD: abdomen is soft, nontender.  no hepatosplenomegaly.  not distended.  no hernia MUSCULOSKELETAL: muscle bulk and strength are grossly normal.  no obvious joint swelling.  gait is normal and steady EXTEMITIES: no deformity.  no ulcer on the feet.  feet are of normal color and temp.  no edema PULSES: dorsalis pedis intact bilat.  no carotid bruit NEURO:  cn 2-12 grossly intact.   readily moves all 4's.  sensation is intact to touch on the feet SKIN:  Normal texture and temperature.  No rash or suspicious lesion is visible.   NODES:  None palpable at the neck PSYCH: alert, well-oriented.  Does not appear anxious nor depressed.   CT (2010): The adrenal glands are negative. Pituitary MRI: 5 mm adenoma Lab Results  Component Value Date   TSH 0.61 04/04/2016   Lab Results  Component Value Date   CREATININE 0.51 04/17/2016   BUN 19 04/17/2016   NA 135 04/17/2016   K 3.6 04/17/2016   CL 99 (L) 04/17/2016    CO2 30 04/17/2016   ACTH stimulation test is done: baseline cortisol level=0 then Cosyntropin 250 mcg is given im 45 minutes later, cortisol level=3 (subnormal response)   I have reviewed outside records, and summarized: Pt was noted to have undetectable, and referred here.  She was noted to have been given prednisone and medrol, but not recently     Impression: Hypocortisolemia, new, uncertain etiology.  It is very unusual to have isolated HPA failure with otherwise intact pituitary function.  Also, I hesitate to commit pt to lifelong prednisone rx.  Therefore, plan will be to rx florinef, and retest in approx 6 weeks.  Weight gain: this symptom is inconsistent with HPA failure.  It is much more consistent with exogenous glucocorticoid rx, so I'll ask pt again. Pituitary microadenoma: new, prob not related to the above.    Plan: Patient is advised the following: Patient Instructions  blood tests are requested for you today.  We'll let you know about the results.

## 2016-04-15 ENCOUNTER — Other Ambulatory Visit: Payer: Managed Care, Other (non HMO)

## 2016-04-15 ENCOUNTER — Ambulatory Visit (INDEPENDENT_AMBULATORY_CARE_PROVIDER_SITE_OTHER): Payer: Managed Care, Other (non HMO)

## 2016-04-15 DIAGNOSIS — R7989 Other specified abnormal findings of blood chemistry: Secondary | ICD-10-CM | POA: Diagnosis not present

## 2016-04-15 DIAGNOSIS — E2740 Unspecified adrenocortical insufficiency: Secondary | ICD-10-CM

## 2016-04-15 MED ORDER — GADOBENATE DIMEGLUMINE 529 MG/ML IV SOLN
10.0000 mL | Freq: Once | INTRAVENOUS | Status: AC | PRN
  Administered 2016-04-15: 10 mL via INTRAVENOUS

## 2016-04-16 NOTE — Patient Instructions (Addendum)
Your procedure is scheduled on:  Monday, Dec. 11, 2017  Enter through the [employer] of [facility] at:  6:00 AM  Pick up the phone at the desk and dial [phone].  Call this number if you have problems the morning of surgery: [phone].  Remember: Do NOT eat food or drink after:  Midnight Sunday, Dec. 10, 2017  Take these medicines the morning of surgery with a SIP OF WATER:  None  Bring Asthma Inhaler day of surgery  Stop ALL herbal medications at this time   Do NOT wear jewelry (body piercing), metal hair clips/bobby pins, make-up, or nail polish. Do NOT wear lotions, powders, or perfumes.  You may wear deodorant. Do NOT shave for 48 hours prior to surgery. Do NOT bring valuables to the hospital. Contacts, dentures, or bridgework may not be worn into surgery.  Leave suitcase in car.  After surgery it may be brought to your room.  For patients admitted to the hospital, checkout time is 11:00 AM the day of discharge.

## 2016-04-17 ENCOUNTER — Other Ambulatory Visit (INDEPENDENT_AMBULATORY_CARE_PROVIDER_SITE_OTHER): Payer: Managed Care, Other (non HMO)

## 2016-04-17 ENCOUNTER — Encounter (HOSPITAL_COMMUNITY)
Admission: RE | Admit: 2016-04-17 | Discharge: 2016-04-17 | Disposition: A | Discharge status: Home / Self Care | Payer: Managed Care, Other (non HMO) | Source: Ambulatory Visit | Attending: Obstetrics and Gynecology | Admitting: Obstetrics and Gynecology

## 2016-04-17 ENCOUNTER — Ambulatory Visit (INDEPENDENT_AMBULATORY_CARE_PROVIDER_SITE_OTHER): Payer: Managed Care, Other (non HMO) | Admitting: Endocrinology

## 2016-04-17 ENCOUNTER — Encounter: Payer: Self-pay | Admitting: Endocrinology

## 2016-04-17 ENCOUNTER — Encounter (HOSPITAL_COMMUNITY): Payer: Self-pay

## 2016-04-17 DIAGNOSIS — E2749 Other adrenocortical insufficiency: Secondary | ICD-10-CM | POA: Diagnosis not present

## 2016-04-17 DIAGNOSIS — E230 Hypopituitarism: Secondary | ICD-10-CM

## 2016-04-17 DIAGNOSIS — Z01812 Encounter for preprocedural laboratory examination: Secondary | ICD-10-CM | POA: Insufficient documentation

## 2016-04-17 HISTORY — DX: Other adrenocortical insufficiency: E27.49

## 2016-04-17 HISTORY — DX: Other specified postprocedural states: Z98.890

## 2016-04-17 HISTORY — DX: Unspecified asthma, uncomplicated: J45.909

## 2016-04-17 HISTORY — DX: Other fatigue: R53.83

## 2016-04-17 HISTORY — DX: Nausea with vomiting, unspecified: R11.2

## 2016-04-17 LAB — URINALYSIS, ROUTINE W REFLEX MICROSCOPIC
Bilirubin Urine: NEGATIVE
Ketones, ur: NEGATIVE
Leukocytes, UA: NEGATIVE
Nitrite: NEGATIVE
Specific Gravity, Urine: 1.01 (ref 1.000–1.030)
Total Protein, Urine: NEGATIVE
Urine Glucose: NEGATIVE
Urobilinogen, UA: 0.2 (ref 0.0–1.0)
pH: 8 (ref 5.0–8.0)

## 2016-04-17 LAB — COMPREHENSIVE METABOLIC PANEL
ALT: 15 U/L (ref 14–54)
AST: 20 U/L (ref 15–41)
Albumin: 3.6 g/dL (ref 3.5–5.0)
Alkaline Phosphatase: 52 U/L (ref 38–126)
Anion gap: 6 (ref 5–15)
BUN: 19 mg/dL (ref 6–20)
CO2: 30 mmol/L (ref 22–32)
Calcium: 9.1 mg/dL (ref 8.9–10.3)
Chloride: 99 mmol/L — ABNORMAL LOW (ref 101–111)
Creatinine, Ser: 0.51 mg/dL (ref 0.44–1.00)
GFR calc Af Amer: >60 mL/min (ref >60)
GFR calc non Af Amer: >60 mL/min (ref >60)
Glucose, Bld: 92 mg/dL (ref 65–99)
Potassium: 3.6 mmol/L (ref 3.5–5.1)
Sodium: 135 mmol/L (ref 135–145)
Total Bilirubin: 0.6 mg/dL (ref 0.3–1.2)
Total Protein: 6.8 g/dL (ref 6.5–8.1)

## 2016-04-17 LAB — TYPE AND SCREEN
ABO/RH(D): O NEG
Antibody Screen: NEGATIVE

## 2016-04-17 LAB — CORTISOL
Cortisol, Plasma: 0 ug/dL
Cortisol, Plasma: 3.2 ug/dL

## 2016-04-17 LAB — CBC
HCT: 39.8 % (ref 36.0–46.0)
Hemoglobin: 13.6 g/dL (ref 12.0–15.0)
MCH: 31.4 pg (ref 26.0–34.0)
MCHC: 34.2 g/dL (ref 30.0–36.0)
MCV: 91.9 fL (ref 78.0–100.0)
Platelets: 340 10*3/uL (ref 150–400)
RBC: 4.33 MIL/uL (ref 3.87–5.11)
RDW: 15.1 % (ref 11.5–15.5)
WBC: 12.2 10*3/uL — ABNORMAL HIGH (ref 4.0–10.5)

## 2016-04-17 LAB — FOLLICLE STIMULATING HORMONE: FSH: 3.1 m[IU]/mL

## 2016-04-17 LAB — T4, FREE: Free T4: 0.83 ng/dL (ref 0.60–1.60)

## 2016-04-17 LAB — LUTEINIZING HORMONE: LH: 0.31 m[IU]/mL

## 2016-04-17 LAB — TSH: TSH: 1.7 u[IU]/mL (ref 0.35–4.50)

## 2016-04-17 LAB — ABO/RH: ABO/RH(D): O NEG

## 2016-04-17 MED ORDER — COSYNTROPIN NICU IV SYRINGE 0.25 MG/ML (STANDARD DOSE)
0.2500 mg | Freq: Once | INTRAVENOUS | Status: AC
  Administered 2016-04-17: 0.25 mg via INTRAMUSCULAR

## 2016-04-17 NOTE — Patient Instructions (Addendum)
blood tests are requested for you today.  We'll let you know about the results.  

## 2016-04-18 LAB — PROLACTIN: Prolactin: 7.2 ng/mL

## 2016-04-18 MED ORDER — FLUDROCORTISONE ACETATE 0.1 MG PO TABS: 0.1000 mg | ORAL_TABLET | Freq: Every day | ORAL | 2 refills | Status: DC

## 2016-04-19 LAB — ACTH: C206 ACTH: <5 pg/mL — ABNORMAL LOW (ref 6–50)

## 2016-04-20 ENCOUNTER — Encounter: Payer: Self-pay | Admitting: Endocrinology

## 2016-04-28 NOTE — Anesthesia Preprocedure Evaluation (Addendum)
Anesthesia Evaluation  Patient identified by MRN, date of birth, ID band Patient awake    Reviewed: Allergy & Precautions, NPO status , Patient's Chart, lab work & pertinent test results  History of Anesthesia Complications (+) PONV and history of anesthetic complications  Airway Mallampati: II  TM Distance: >3 FB Neck ROM: Full    Dental  (+) Teeth Intact, Dental Advisory Given, Caps   Pulmonary asthma ,    Pulmonary exam normal breath sounds clear to auscultation       Cardiovascular Exercise Tolerance: Good (-) hypertension(-) angina(-) CAD and (-) CHF Normal cardiovascular exam Rhythm:Regular Rate:Normal  Atrial tachycardia s/p ablation x2   Neuro/Psych PSYCHIATRIC DISORDERS Anxiety Depression negative neurological ROS     GI/Hepatic negative GI ROS, Neg liver ROS,   Endo/Other  negative endocrine ROS  Renal/GU negative Renal ROS     Musculoskeletal negative musculoskeletal ROS (+)   Abdominal   Peds  Hematology negative hematology ROS (+)   Anesthesia Other Findings Day of surgery medications reviewed with the patient.  Reproductive/Obstetrics negative OB ROS                            Anesthesia Physical Anesthesia Plan  ASA: II  Anesthesia Plan: General   Post-op Pain Management:    Induction: Intravenous  Airway Management Planned: Oral ETT  Additional Equipment:   Intra-op Plan:   Post-operative Plan: Extubation in OR  Informed Consent: I have reviewed the patients History and Physical, chart, labs and discussed the procedure including the risks, benefits and alternatives for the proposed anesthesia with the patient or authorized representative who has indicated his/her understanding and acceptance.   Dental advisory given  Plan Discussed with: CRNA  Anesthesia Plan Comments: (Risks/benefits of general anesthesia discussed with patient including risk of damage to  teeth, lips, gum, and tongue, nausea/vomiting, allergic reactions to medications, and the possibility of heart attack, stroke and death.  All patient questions answered.  Patient wishes to proceed.)        Anesthesia Quick Evaluation

## 2016-04-28 NOTE — H&P (Signed)
[name] is an 45 y.o. [id number] with pelvic relaxation for LAVH/BS/Anterior and Posterior repair/midurethral sling.D/w pt r/b/a of surgery.  Pt currently with pelvic pressure and bulge.    Pertinent Gynecological History: [id number] - SVD x4 - 39wk x 2, 37 week, 36 week,l 6#1-7#8 No abn pap, last HR HPV neg, 1/15 Nl MMG 1/15 herpes   Menstrual History: Patient's last menstrual period was 03/21/2016.    Past Medical History:  Diagnosis Date  . Asthma    allergy, exercise induced  . Atrial tachycardia ([place])   . Decreased cortisol level ([place])   . Lethargic   . Ovarian cyst   . PONV (postoperative nausea and vomiting)   bipolar, atrial tachycardia  Past Surgical History:  Procedure Laterality Date  . ablasion    . ANTERIOR CRUCIATE LIGAMENT REPAIR Left   . BREAST SURGERY     augmentation  . CARDIAC ELECTROPHYSIOLOGY STUDY AND ABLATION     twice  . MYRINGOTOMY    . NASAL SEPTUM SURGERY    . TONSILLECTOMY AND ADENOIDECTOMY    . TUBAL LIGATION    . WISDOM TOOTH EXTRACTION     Family History: CAD, HTN, uterine CA, breast CA, esophageal CA,leukemia,lung CA, ovarian CA  Social History:  reports that she has never smoked. She has never used smokeless tobacco. She reports that she drinks alcohol. She reports that she does not use drugs. separated, Investment banker, operational  Allergies:  Allergies  Allergen Reactions  . Demerol Nausea And Vomiting and Swelling    Meds: atomoxetine 25, doxepin, fludrocortisone, lasix 20, temazepam, tizantidine, valtrex    Review of Systems  Constitutional: Negative.   HENT: Negative.   Eyes: Negative.   Respiratory: Negative.   Cardiovascular: Negative.   Gastrointestinal: Negative.   Genitourinary: Positive for frequency.       Incontinence (SUI)  Musculoskeletal: Negative.   Skin: Negative.   Neurological: Negative.   Psychiatric/Behavioral: Negative.     Last menstrual period 03/21/2016. Physical Exam  Constitutional: She is  oriented to person, place, and time. She appears well-developed and well-nourished.  HENT:  Head: Normocephalic and atraumatic.  Cardiovascular: Normal rate and regular rhythm.   Respiratory: Effort normal and breath sounds normal. No respiratory distress. She has no wheezes.  GI: Soft. Bowel sounds are normal. She exhibits no distension. There is no tenderness.  Musculoskeletal: Normal range of motion.  Neurological: She is alert and oriented to person, place, and time.  Skin: Skin is warm and dry.  Psychiatric: She has a normal mood and affect. Her behavior is normal.   Ur Cx no growth O neg Cr 0.51, nl LFTs,  Hgb 13.6    Assessment/Plan: 45yo [id number] for LAVH/BS/A&P repair, also midurethral sling with Dr Willis Modena. D/w pt r/b/a of surgery. Ancef for prophylaxis.  Will proceed  Bovard-Stuckert, [name] 04/28/2016, 9:10 PM

## 2016-04-29 ENCOUNTER — Encounter (HOSPITAL_COMMUNITY)
Admission: RE | Disposition: A | Discharge status: Home / Self Care | Payer: Self-pay | Source: Ambulatory Visit | Attending: Obstetrics and Gynecology

## 2016-04-29 ENCOUNTER — Ambulatory Visit (HOSPITAL_COMMUNITY): Payer: Managed Care, Other (non HMO) | Admitting: Anesthesiology

## 2016-04-29 ENCOUNTER — Encounter (HOSPITAL_COMMUNITY): Payer: Self-pay

## 2016-04-29 ENCOUNTER — Observation Stay (HOSPITAL_COMMUNITY)
Admission: RE | Admit: 2016-04-29 | Discharge: 2016-04-30 | Disposition: A | Discharge status: Home / Self Care | Payer: Managed Care, Other (non HMO) | Source: Ambulatory Visit | Attending: Obstetrics and Gynecology | Admitting: Obstetrics and Gynecology

## 2016-04-29 DIAGNOSIS — Z800 Family history of malignant neoplasm of digestive organs: Secondary | ICD-10-CM | POA: Diagnosis not present

## 2016-04-29 DIAGNOSIS — Z8041 Family history of malignant neoplasm of ovary: Secondary | ICD-10-CM | POA: Insufficient documentation

## 2016-04-29 DIAGNOSIS — Y92530 Ambulatory surgery center as the place of occurrence of the external cause: Secondary | ICD-10-CM | POA: Insufficient documentation

## 2016-04-29 DIAGNOSIS — N8189 Other female genital prolapse: Secondary | ICD-10-CM | POA: Insufficient documentation

## 2016-04-29 DIAGNOSIS — D259 Leiomyoma of uterus, unspecified: Secondary | ICD-10-CM | POA: Diagnosis not present

## 2016-04-29 DIAGNOSIS — N9971 Accidental puncture and laceration of a genitourinary system organ or structure during a genitourinary system procedure: Secondary | ICD-10-CM | POA: Insufficient documentation

## 2016-04-29 DIAGNOSIS — N393 Stress incontinence (female) (male): Principal | ICD-10-CM | POA: Insufficient documentation

## 2016-04-29 DIAGNOSIS — Z90722 Acquired absence of ovaries, bilateral: Secondary | ICD-10-CM

## 2016-04-29 DIAGNOSIS — Y836 Removal of other organ (partial) (total) as the cause of abnormal reaction of the patient, or of later complication, without mention of misadventure at the time of the procedure: Secondary | ICD-10-CM | POA: Diagnosis not present

## 2016-04-29 DIAGNOSIS — Z8049 Family history of malignant neoplasm of other genital organs: Secondary | ICD-10-CM | POA: Diagnosis not present

## 2016-04-29 DIAGNOSIS — Z801 Family history of malignant neoplasm of trachea, bronchus and lung: Secondary | ICD-10-CM | POA: Insufficient documentation

## 2016-04-29 DIAGNOSIS — Z803 Family history of malignant neoplasm of breast: Secondary | ICD-10-CM | POA: Insufficient documentation

## 2016-04-29 DIAGNOSIS — Z90710 Acquired absence of both cervix and uterus: Secondary | ICD-10-CM

## 2016-04-29 DIAGNOSIS — N946 Dysmenorrhea, unspecified: Secondary | ICD-10-CM | POA: Diagnosis not present

## 2016-04-29 DIAGNOSIS — Z806 Family history of leukemia: Secondary | ICD-10-CM | POA: Diagnosis not present

## 2016-04-29 HISTORY — PX: TOTAL LAPAROSCOPIC HYSTERECTOMY WITH SALPINGECTOMY: SHX6742

## 2016-04-29 HISTORY — PX: CYSTOSCOPY: SHX5120

## 2016-04-29 HISTORY — PX: LAPAROSCOPIC VAGINAL HYSTERECTOMY WITH SALPINGECTOMY: SHX6680

## 2016-04-29 HISTORY — DX: Acquired absence of ovaries, bilateral: Z90.722

## 2016-04-29 HISTORY — PX: ANTERIOR AND POSTERIOR REPAIR: SHX5121

## 2016-04-29 HISTORY — DX: Acquired absence of other genital organ(s): Z90.710

## 2016-04-29 HISTORY — PX: BLADDER SUSPENSION: SHX72

## 2016-04-29 LAB — PREGNANCY, URINE: Preg Test, Ur: NEGATIVE

## 2016-04-29 SURGERY — HYSTERECTOMY, VAGINAL, LAPAROSCOPY-ASSISTED, WITH SALPINGECTOMY
Anesthesia: General | Site: Vagina

## 2016-04-29 MED ORDER — SODIUM CHLORIDE 0.9 % IJ SOLN
INTRAMUSCULAR | Status: DC | PRN
  Administered 2016-04-29: 10 mL via INTRAVENOUS

## 2016-04-29 MED ORDER — DIPHENHYDRAMINE HCL 12.5 MG/5ML PO ELIX
12.5000 mg | ORAL_SOLUTION | Freq: Four times a day (QID) | ORAL | Status: DC | PRN
  Administered 2016-04-29 – 2016-04-30 (×2): 12.5 mg via ORAL
  Filled 2016-04-29 (×2): qty 5

## 2016-04-29 MED ORDER — CEFAZOLIN SODIUM-DEXTROSE 2-4 GM/100ML-% IV SOLN
2.0000 g | Freq: Once | INTRAVENOUS | Status: AC
  Administered 2016-04-29: 2 g via INTRAVENOUS
  Filled 2016-04-29: qty 100

## 2016-04-29 MED ORDER — BUPIVACAINE-EPINEPHRINE (PF) 0.5% -1:200000 IJ SOLN
INTRAMUSCULAR | Status: AC
  Filled 2016-04-29: qty 30

## 2016-04-29 MED ORDER — PROPOFOL 10 MG/ML IV BOLUS
INTRAVENOUS | Status: DC | PRN
  Administered 2016-04-29: 100 mg via INTRAVENOUS

## 2016-04-29 MED ORDER — IBUPROFEN 800 MG PO TABS
800.0000 mg | ORAL_TABLET | Freq: Three times a day (TID) | ORAL | Status: DC | PRN
  Administered 2016-04-30: 800 mg via ORAL
  Filled 2016-04-29: qty 1

## 2016-04-29 MED ORDER — LIDOCAINE HCL (CARDIAC) 20 MG/ML IV SOLN
INTRAVENOUS | Status: AC
  Filled 2016-04-29: qty 5

## 2016-04-29 MED ORDER — KETOROLAC TROMETHAMINE 30 MG/ML IJ SOLN
INTRAMUSCULAR | Status: AC
  Filled 2016-04-29: qty 1

## 2016-04-29 MED ORDER — FENTANYL CITRATE (PF) 250 MCG/5ML IJ SOLN
INTRAMUSCULAR | Status: AC
  Filled 2016-04-29: qty 5

## 2016-04-29 MED ORDER — ONDANSETRON HCL 4 MG/2ML IJ SOLN: 4.0000 mg | Freq: Four times a day (QID) | INTRAMUSCULAR | Status: DC | PRN

## 2016-04-29 MED ORDER — FENTANYL CITRATE (PF) 100 MCG/2ML IJ SOLN
INTRAMUSCULAR | Status: DC | PRN
  Administered 2016-04-29 (×9): 50 ug via INTRAVENOUS

## 2016-04-29 MED ORDER — ALUM & MAG HYDROXIDE-SIMETH 200-200-20 MG/5ML PO SUSP
30.0000 mL | ORAL | Status: DC | PRN
  Administered 2016-04-29: 30 mL via ORAL
  Filled 2016-04-29 (×2): qty 30

## 2016-04-29 MED ORDER — HYDROMORPHONE HCL 1 MG/ML IJ SOLN
INTRAMUSCULAR | Status: AC
  Filled 2016-04-29: qty 1

## 2016-04-29 MED ORDER — VASOPRESSIN 20 UNIT/ML IV SOLN
INTRAVENOUS | Status: DC | PRN
  Administered 2016-04-29: 18 mL via INTRAMUSCULAR
  Administered 2016-04-29: 10 mL via INTRAMUSCULAR

## 2016-04-29 MED ORDER — BUPIVACAINE-EPINEPHRINE 0.5% -1:200000 IJ SOLN
INTRAMUSCULAR | Status: DC | PRN
  Administered 2016-04-29: 20 mL

## 2016-04-29 MED ORDER — MIDAZOLAM HCL 2 MG/2ML IJ SOLN
INTRAMUSCULAR | Status: AC
  Filled 2016-04-29: qty 2

## 2016-04-29 MED ORDER — FENTANYL CITRATE (PF) 100 MCG/2ML IJ SOLN
INTRAMUSCULAR | Status: AC
  Filled 2016-04-29: qty 2

## 2016-04-29 MED ORDER — HYDROMORPHONE 1 MG/ML IV SOLN
INTRAVENOUS | Status: DC
  Administered 2016-04-29: 3.6 mg via INTRAVENOUS
  Administered 2016-04-29: 2.6 mg via INTRAVENOUS
  Administered 2016-04-29: 15:00:00 via INTRAVENOUS
  Administered 2016-04-30: 2.2 mg via INTRAVENOUS
  Administered 2016-04-30: 2.8 mg via INTRAVENOUS
  Administered 2016-04-30: 1.8 mg via INTRAVENOUS
  Filled 2016-04-29: qty 25

## 2016-04-29 MED ORDER — ROCURONIUM BROMIDE 100 MG/10ML IV SOLN
INTRAVENOUS | Status: AC
  Filled 2016-04-29: qty 1

## 2016-04-29 MED ORDER — LACTATED RINGERS IV SOLN
INTRAVENOUS | Status: DC
  Administered 2016-04-29: 125 mL/h via INTRAVENOUS
  Administered 2016-04-29 (×3): via INTRAVENOUS

## 2016-04-29 MED ORDER — HEPARIN SODIUM (PORCINE) 5000 UNIT/ML IJ SOLN
INTRAMUSCULAR | Status: AC
  Filled 2016-04-29: qty 1

## 2016-04-29 MED ORDER — LACTATED RINGERS IV SOLN
INTRAVENOUS | Status: DC
  Administered 2016-04-29 – 2016-04-30 (×2): via INTRAVENOUS

## 2016-04-29 MED ORDER — OXYCODONE-ACETAMINOPHEN 5-325 MG PO TABS
1.0000 | ORAL_TABLET | ORAL | Status: DC | PRN
  Administered 2016-04-29 – 2016-04-30 (×2): 2 via ORAL
  Filled 2016-04-29 (×2): qty 2

## 2016-04-29 MED ORDER — DEXAMETHASONE SODIUM PHOSPHATE 10 MG/ML IJ SOLN
INTRAMUSCULAR | Status: DC | PRN
  Administered 2016-04-29: 8 mg via INTRAVENOUS

## 2016-04-29 MED ORDER — BUPIVACAINE HCL (PF) 0.25 % IJ SOLN
INTRAMUSCULAR | Status: AC
  Filled 2016-04-29: qty 30

## 2016-04-29 MED ORDER — DEXAMETHASONE SODIUM PHOSPHATE 10 MG/ML IJ SOLN
INTRAMUSCULAR | Status: AC
  Filled 2016-04-29: qty 1

## 2016-04-29 MED ORDER — GLYCOPYRROLATE 0.2 MG/ML IJ SOLN
INTRAMUSCULAR | Status: AC
  Filled 2016-04-29: qty 1

## 2016-04-29 MED ORDER — ROCURONIUM BROMIDE 100 MG/10ML IV SOLN
INTRAVENOUS | Status: DC | PRN
  Administered 2016-04-29: 10 mg via INTRAVENOUS
  Administered 2016-04-29: 50 mg via INTRAVENOUS
  Administered 2016-04-29 (×7): 10 mg via INTRAVENOUS

## 2016-04-29 MED ORDER — LIDOCAINE HCL 1 % IJ SOLN
INTRAMUSCULAR | Status: AC
Start: 2016-04-29 — End: 2016-04-29
  Filled 2016-04-29: qty 20

## 2016-04-29 MED ORDER — ESTRADIOL 0.1 MG/GM VA CREA
TOPICAL_CREAM | VAGINAL | Status: DC | PRN
  Administered 2016-04-29: 1 via VAGINAL

## 2016-04-29 MED ORDER — STERILE WATER FOR IRRIGATION IR SOLN
Status: DC | PRN
  Administered 2016-04-29: 1000 mL

## 2016-04-29 MED ORDER — SUGAMMADEX SODIUM 200 MG/2ML IV SOLN
INTRAVENOUS | Status: DC | PRN
  Administered 2016-04-29: 129.8 mg via INTRAVENOUS

## 2016-04-29 MED ORDER — ESTRADIOL 0.1 MG/GM VA CREA
TOPICAL_CREAM | VAGINAL | Status: AC
  Filled 2016-04-29: qty 42.5

## 2016-04-29 MED ORDER — HYDROMORPHONE HCL 1 MG/ML IJ SOLN
INTRAMUSCULAR | Status: DC | PRN
  Administered 2016-04-29 (×2): 0.5 mg via INTRAVENOUS
  Administered 2016-04-29: 1 mg via INTRAVENOUS

## 2016-04-29 MED ORDER — VASOPRESSIN 20 UNIT/ML IV SOLN
INTRAVENOUS | Status: AC
  Filled 2016-04-29: qty 1

## 2016-04-29 MED ORDER — MENTHOL 3 MG MT LOZG: 1.0000 | LOZENGE | OROMUCOSAL | Status: DC | PRN

## 2016-04-29 MED ORDER — SCOPOLAMINE 1 MG/3DAYS TD PT72
1.0000 | MEDICATED_PATCH | Freq: Once | TRANSDERMAL | Status: DC
  Administered 2016-04-29: 1.5 mg via TRANSDERMAL

## 2016-04-29 MED ORDER — CEFAZOLIN SODIUM-DEXTROSE 2-4 GM/100ML-% IV SOLN
INTRAVENOUS | Status: AC
  Filled 2016-04-29: qty 100

## 2016-04-29 MED ORDER — DIPHENHYDRAMINE HCL 50 MG/ML IJ SOLN: 12.5000 mg | Freq: Four times a day (QID) | INTRAMUSCULAR | Status: DC | PRN

## 2016-04-29 MED ORDER — SODIUM CHLORIDE 0.9% FLUSH: 9.0000 mL | INTRAVENOUS | Status: DC | PRN

## 2016-04-29 MED ORDER — SODIUM CHLORIDE 0.9 % IJ SOLN
INTRAMUSCULAR | Status: AC
  Filled 2016-04-29: qty 100

## 2016-04-29 MED ORDER — PROMETHAZINE HCL 25 MG/ML IJ SOLN
6.2500 mg | INTRAMUSCULAR | Status: DC | PRN
Start: 2016-04-29 — End: 2016-04-29

## 2016-04-29 MED ORDER — PROPOFOL 10 MG/ML IV BOLUS
INTRAVENOUS | Status: AC
  Filled 2016-04-29: qty 20

## 2016-04-29 MED ORDER — BUPIVACAINE HCL (PF) 0.25 % IJ SOLN
INTRAMUSCULAR | Status: DC | PRN
  Administered 2016-04-29: 8 mL

## 2016-04-29 MED ORDER — KETOROLAC TROMETHAMINE 30 MG/ML IJ SOLN
INTRAMUSCULAR | Status: DC | PRN
  Administered 2016-04-29: 30 mg via INTRAVENOUS

## 2016-04-29 MED ORDER — SCOPOLAMINE 1 MG/3DAYS TD PT72
MEDICATED_PATCH | TRANSDERMAL | Status: AC
  Administered 2016-04-29: 1.5 mg via TRANSDERMAL
  Filled 2016-04-29: qty 1

## 2016-04-29 MED ORDER — NALOXONE HCL 0.4 MG/ML IJ SOLN: 0.4000 mg | INTRAMUSCULAR | Status: DC | PRN

## 2016-04-29 MED ORDER — FENTANYL CITRATE (PF) 100 MCG/2ML IJ SOLN
25.0000 ug | INTRAMUSCULAR | Status: DC | PRN
  Administered 2016-04-29: 25 ug via INTRAVENOUS
  Administered 2016-04-29: 50 ug via INTRAVENOUS
  Administered 2016-04-29: 25 ug via INTRAVENOUS
  Administered 2016-04-29: 50 ug via INTRAVENOUS

## 2016-04-29 MED ORDER — GLYCOPYRROLATE 0.2 MG/ML IJ SOLN
INTRAMUSCULAR | Status: DC | PRN
  Administered 2016-04-29 (×2): 0.1 mg via INTRAVENOUS

## 2016-04-29 MED ORDER — FENTANYL CITRATE (PF) 100 MCG/2ML IJ SOLN
INTRAMUSCULAR | Status: AC
  Administered 2016-04-29: 25 ug via INTRAVENOUS
  Filled 2016-04-29: qty 2

## 2016-04-29 MED ORDER — LACTATED RINGERS IV SOLN
INTRAVENOUS | Status: DC
  Administered 2016-04-29: 07:00:00 via INTRAVENOUS

## 2016-04-29 MED ORDER — HYDROMORPHONE HCL 1 MG/ML IJ SOLN
INTRAMUSCULAR | Status: AC
Start: 2016-04-29 — End: 2016-04-29
  Filled 2016-04-29: qty 1

## 2016-04-29 MED ORDER — MEPERIDINE HCL 25 MG/ML IJ SOLN
INTRAMUSCULAR | Status: AC
  Filled 2016-04-29: qty 1

## 2016-04-29 MED ORDER — SUGAMMADEX SODIUM 200 MG/2ML IV SOLN
INTRAVENOUS | Status: AC
  Filled 2016-04-29: qty 2

## 2016-04-29 MED ORDER — LIDOCAINE HCL (CARDIAC) 20 MG/ML IV SOLN
INTRAVENOUS | Status: DC | PRN
  Administered 2016-04-29: 80 mg via INTRAVENOUS

## 2016-04-29 MED ORDER — ONDANSETRON HCL 4 MG/2ML IJ SOLN
INTRAMUSCULAR | Status: AC
  Filled 2016-04-29: qty 2

## 2016-04-29 MED ORDER — ONDANSETRON HCL 4 MG PO TABS: 4.0000 mg | ORAL_TABLET | Freq: Four times a day (QID) | ORAL | Status: DC | PRN

## 2016-04-29 MED ORDER — SODIUM CHLORIDE 0.9 % IJ SOLN
INTRAMUSCULAR | Status: AC
  Filled 2016-04-29: qty 50

## 2016-04-29 MED ORDER — GUAIFENESIN 100 MG/5ML PO SOLN: 15.0000 mL | ORAL | Status: DC | PRN

## 2016-04-29 MED ORDER — SIMETHICONE 80 MG PO CHEW
80.0000 mg | CHEWABLE_TABLET | Freq: Four times a day (QID) | ORAL | Status: DC | PRN
  Administered 2016-04-29: 80 mg via ORAL
  Filled 2016-04-29: qty 1

## 2016-04-29 MED ORDER — CEFAZOLIN SODIUM-DEXTROSE 2-4 GM/100ML-% IV SOLN
2.0000 g | INTRAVENOUS | Status: AC
  Administered 2016-04-29: 2 g via INTRAVENOUS

## 2016-04-29 MED ORDER — MIDAZOLAM HCL 2 MG/2ML IJ SOLN
INTRAMUSCULAR | Status: DC | PRN
  Administered 2016-04-29 (×2): 1 mg via INTRAVENOUS

## 2016-04-29 MED ORDER — ONDANSETRON HCL 4 MG/2ML IJ SOLN
INTRAMUSCULAR | Status: DC | PRN
  Administered 2016-04-29: 4 mg via INTRAVENOUS

## 2016-04-29 SURGICAL SUPPLY — 61 items
BLADE SURG 15 STRL LF C SS BP (BLADE) ×4 IMPLANT
BLADE SURG 15 STRL SS (BLADE) ×2
CABLE HIGH FREQUENCY MONO STRZ (ELECTRODE) IMPLANT
CANISTER SUCT 3000ML (MISCELLANEOUS) ×6 IMPLANT
CLOTH BEACON ORANGE TIMEOUT ST (SAFETY) ×6 IMPLANT
CONT PATH 16OZ SNAP LID 3702 (MISCELLANEOUS) ×6 IMPLANT
COVER BACK TABLE 60X90IN (DRAPES) ×6 IMPLANT
DECANTER SPIKE VIAL GLASS SM (MISCELLANEOUS) ×18 IMPLANT
DERMABOND ADHESIVE PROPEN (GAUZE/BANDAGES/DRESSINGS) ×2
DERMABOND ADVANCED .7 DNX6 (GAUZE/BANDAGES/DRESSINGS) ×4 IMPLANT
DRSG OPSITE POSTOP 3X4 (GAUZE/BANDAGES/DRESSINGS) IMPLANT
DURAPREP 26ML APPLICATOR (WOUND CARE) ×6 IMPLANT
ELECT REM PT RETURN 9FT ADLT (ELECTROSURGICAL) ×6
ELECTRODE REM PT RTRN 9FT ADLT (ELECTROSURGICAL) ×4 IMPLANT
FILTER SMOKE EVAC LAPAROSHD (FILTER) ×6 IMPLANT
GAUZE PACKING 2X5 YD STRL (GAUZE/BANDAGES/DRESSINGS) ×6 IMPLANT
GAUZE SPONGE 4X4 16PLY XRAY LF (GAUZE/BANDAGES/DRESSINGS) ×6 IMPLANT
GLOVE BIO SURGEON STRL SZ 6.5 (GLOVE) ×15 IMPLANT
GLOVE BIO SURGEONS STRL SZ 6.5 (GLOVE) ×3
GLOVE BIOGEL PI IND STRL 7.0 (GLOVE) ×12 IMPLANT
GLOVE BIOGEL PI INDICATOR 7.0 (GLOVE) ×6
GOWN STRL REUS W/TWL LRG LVL3 (GOWN DISPOSABLE) ×24 IMPLANT
LEGGING LITHOTOMY PAIR STRL (DRAPES) ×6 IMPLANT
NEEDLE HYPO 22GX1.5 SAFETY (NEEDLE) IMPLANT
NEEDLE INSUFFLATION 120MM (ENDOMECHANICALS) ×6 IMPLANT
NEEDLE MAYO CATGUT SZ4 (NEEDLE) IMPLANT
NEEDLE SPNL 22GX3.5 QUINCKE BK (NEEDLE) IMPLANT
NS IRRIG 1000ML POUR BTL (IV SOLUTION) ×6 IMPLANT
PACK LAVH (CUSTOM PROCEDURE TRAY) ×6 IMPLANT
PACK ROBOTIC GOWN (GOWN DISPOSABLE) ×6 IMPLANT
PACK TRENDGUARD 450 HYBRID PRO (MISCELLANEOUS) ×4 IMPLANT
PACK TRENDGUARD 600 HYBRD PROC (MISCELLANEOUS) IMPLANT
PACK VAGINAL WOMENS (CUSTOM PROCEDURE TRAY) IMPLANT
PROTECTOR NERVE ULNAR (MISCELLANEOUS) ×12 IMPLANT
SET CYSTO W/LG BORE CLAMP LF (SET/KITS/TRAYS/PACK) ×6 IMPLANT
SET IRRIG TUBING LAPAROSCOPIC (IRRIGATION / IRRIGATOR) ×6 IMPLANT
SHEARS HARMONIC ACE PLUS 36CM (ENDOMECHANICALS) ×6 IMPLANT
SLEEVE XCEL OPT CAN 5 100 (ENDOMECHANICALS) ×12 IMPLANT
SLING TRANS VAGINAL TAPE (Sling) IMPLANT
SLING UTERINE/ABD GYNECARE TVT (Sling) IMPLANT
SUT PROLENE 1 CTX 30  8455H (SUTURE)
SUT PROLENE 1 CTX 30 8455H (SUTURE) IMPLANT
SUT SILK 0 SH 30 (SUTURE) ×6 IMPLANT
SUT VIC AB 1 CT1 18XBRD ANBCTR (SUTURE) ×8 IMPLANT
SUT VIC AB 1 CT1 8-18 (SUTURE) ×4
SUT VIC AB 2-0 CT1 (SUTURE) ×36 IMPLANT
SUT VIC AB 2-0 CT2 27 (SUTURE) ×36 IMPLANT
SUT VIC AB 3-0 CT1 27 (SUTURE)
SUT VIC AB 3-0 CT1 TAPERPNT 27 (SUTURE) IMPLANT
SUT VIC AB 3-0 SH 27 (SUTURE) ×6
SUT VIC AB 3-0 SH 27X BRD (SUTURE) ×12 IMPLANT
SUT VIC AB 4-0 PS2 27 (SUTURE) ×6 IMPLANT
SUT VICRYL 0 TIES 12 18 (SUTURE) ×6 IMPLANT
SUT VICRYL 0 UR6 27IN ABS (SUTURE) IMPLANT
TOWEL OR 17X24 6PK STRL BLUE (TOWEL DISPOSABLE) ×12 IMPLANT
TRAY FOLEY CATH SILVER 14FR (SET/KITS/TRAYS/PACK) ×6 IMPLANT
TRENDGUARD 450 HYBRID PRO PACK (MISCELLANEOUS) ×6
TRENDGUARD 600 HYBRID PROC PK (MISCELLANEOUS)
TROCAR XCEL NON-BLD 5MMX100MML (ENDOMECHANICALS) ×6 IMPLANT
WARMER LAPAROSCOPE (MISCELLANEOUS) ×6 IMPLANT
WATER STERILE IRR 1000ML POUR (IV SOLUTION) IMPLANT

## 2016-04-29 NOTE — Transfer of Care (Signed)
Immediate Anesthesia Transfer of Care Note  Patient: [name]  Procedure(s) Performed: Procedure(s): LAPAROSCOPIC ASSISTED VAGINAL HYSTERECTOMY WITH SALPINGECTOMY (Bilateral) ANTERIOR (CYSTOCELE) AND POSTERIOR REPAIR (RECTOCELE) (N/A) TRANSVAGINAL TAPE (TVT) PROCEDURE (N/A)  Patient Location: PACU  Anesthesia Type:General  Level of Consciousness: awake, alert , oriented and patient cooperative  Airway & Oxygen Therapy: Patient Spontanous Breathing and Patient connected to nasal cannula oxygen  Post-op Assessment: Report given to RN and Post -op Vital signs reviewed and stable  Post vital signs: Reviewed and stable  Last Vitals:  Vitals:   04/29/16 0601  BP: 106/79  Pulse: (!) 57  Resp: 18  Temp: 36.5 C    Last Pain:  Vitals:   04/29/16 0601  TempSrc: Oral         Complications: No apparent anesthesia complications

## 2016-04-29 NOTE — Anesthesia Postprocedure Evaluation (Signed)
Anesthesia Post Note  Patient: [name]  Procedure(s) Performed: Procedure(s) (LRB): LAPAROSCOPIC ASSISTED VAGINAL HYSTERECTOMY WITH Bilateral  SALPINGECTOMY (Bilateral) ANTERIOR (CYSTOCELE) AND POSTERIOR REPAIR (RECTOCELE) (N/A) Attempted TRANSVAGINAL TAPE (TVT) PROCEDURE, Cystotomy CYSTOSCOPY (N/A)  Anesthesia Type: General Level of consciousness: awake and sedated Pain management: pain level controlled Vital Signs Assessment: post-procedure vital signs reviewed and stable Respiratory status: spontaneous breathing Cardiovascular status: stable Postop Assessment: no signs of nausea or vomiting Anesthetic complications: no     Last Vitals:  Vitals:   04/29/16 1315 04/29/16 1330  BP: 117/77 120/76  Pulse: 92 86  Resp: 17 (!) 24  Temp:      Last Pain:  Vitals:   04/29/16 1330  TempSrc:   PainSc: 4    Pain Goal: Patients Stated Pain Goal: 1 (04/29/16 1330)               [name],JOHN Mateo Flow

## 2016-04-29 NOTE — Interval H&P Note (Signed)
History and Physical Interval Note:  04/29/2016 7:06 AM  [name]  has presented today for surgery, with the diagnosis of relaxation of pelvic floor, stress incontinence  The various methods of treatment have been discussed with the patient and family. After consideration of risks, benefits and other options for treatment, the patient has consented to  Procedure(s): LAPAROSCOPIC ASSISTED VAGINAL HYSTERECTOMY WITH SALPINGECTOMY (Bilateral) ANTERIOR (CYSTOCELE) AND POSTERIOR REPAIR (RECTOCELE) (N/A) TRANSVAGINAL TAPE (TVT) PROCEDURE (N/A) as a surgical intervention .  The patient's history has been reviewed, patient examined, no change in status, stable for surgery.  I have reviewed the patient's chart and labs.  Questions were answered to the patient's satisfaction.     Bovard-Stuckert, Atley Scarboro

## 2016-04-29 NOTE — Anesthesia Postprocedure Evaluation (Signed)
Anesthesia Post Note  Patient: [name]  Procedure(s) Performed: Procedure(s) (LRB): LAPAROSCOPIC ASSISTED VAGINAL HYSTERECTOMY WITH Bilateral  SALPINGECTOMY (Bilateral) ANTERIOR (CYSTOCELE) AND POSTERIOR REPAIR (RECTOCELE) (N/A) Attempted TRANSVAGINAL TAPE (TVT) PROCEDURE, Cystotomy CYSTOSCOPY (N/A)  Patient location during evaluation: [facility] Anesthesia Type: General Level of consciousness: awake Pain management: satisfactory to patient Vital Signs Assessment: post-procedure vital signs reviewed and stable Respiratory status: spontaneous breathing Cardiovascular status: stable Anesthetic complications: no     Last Vitals:  Vitals:   04/29/16 2040 04/29/16 2148  BP: (!) 144/68   Pulse: (!) 59   Resp: 18 20  Temp: 36.9 C     Last Pain:  Vitals:   04/29/16 2148  TempSrc:   PainSc: 8    Pain Goal: Patients Stated Pain Goal: 2 (04/29/16 2000)               Casimer Lanius

## 2016-04-29 NOTE — Op Note (Signed)
Preoperative diagnosis: Stress urinary incontinence with low UCP Postoperative diagnosis: Same Procedure: Attempted TVT with incidental cystotomy Surgeon: Cheri Fowler M.D. Assistant:  Janyth Contes, MD Anesthesia: Gen. Findings:  No sling guide was identified in the bladder via cystoscopy.  However, upon pulling the sling through, there was fluid leakage on the left c/w bladder injury Estimated blood loss: 50 cc Complications: None  Procedure in detail:  Patient had previously been anesthetized and underwent LAVH and anterior repair.  Legs were elevated about halfway in the stirrups. Local anesthetic with half percent Marcaine with epi was infiltrated suprapubically on each side along the anticipated path of the TVT. The bladder was drained of about 200cc urine. The anterior vagina was grasped with Allis clamps proximally 1 1/2 fingerbreadths from the urethral meatus.  A 1 cm vertical incision was was then made in the vagina between the Allis clamps. The edges of the incision were then grasped with the Allis clamps.  Metzenbaum scissors were used to sharply dissect the vaginal mucosa to beneath the pubic symphysis on each side.  The patient was then placed in trendelenberg.  Small stab incisions were then made suprapubically and the sling guide was placed on each side, careful to hug the back side of the pubic symphysis, and pass the guides through the vaginal incision. An 75fr foley catheter guide was inserted through the urethra into the bladder to aid with the guide placement.  Cystoscopy was then performed and I was unable to see either guide in the bladder.  The sling was attached to the guides and the sling was pulled through the suprapubic incisions.  In doing this, cystoscopy fluid began to run out along the left arm of the sling c/w bladder injury.  Since this was now a fair sized injury to the bladder instead of just a small puncture from the sling guide, I elected to remove the  sling, place a foley catheter, and save the sling for another procedure.   I left the operative field, and Dr. Sandford Craze closed the small vaginal incision with 2-0 Vicryl.    The rest of the case will be dictated by Dr. Sandford Craze.

## 2016-04-29 NOTE — Addendum Note (Signed)
Addendum  created 04/29/16 2234 by Asher Muir, CRNA   Sign clinical note

## 2016-04-29 NOTE — Progress Notes (Signed)
Day of Surgery Procedure(s) (LRB): LAPAROSCOPIC ASSISTED VAGINAL HYSTERECTOMY WITH Bilateral  SALPINGECTOMY (Bilateral) ANTERIOR (CYSTOCELE) AND POSTERIOR REPAIR (RECTOCELE) (N/A) Attempted TRANSVAGINAL TAPE (TVT) PROCEDURE, Cystotomy CYSTOSCOPY (N/A)  Subjective: Patient reports incisional pain and tolerating PO.    Objective: I have reviewed patient's vital signs, intake and output and medications.  General: alert and no distress Resp: clear to auscultation bilaterally Cardio: regular rate and rhythm GI: soft, non-tender; bowel sounds normal; no masses,  no organomegaly Extremities: extremities normal, atraumatic, no cyanosis or edema  Inc: C/D/I  Assessment: s/p Procedure(s): LAPAROSCOPIC ASSISTED VAGINAL HYSTERECTOMY WITH Bilateral  SALPINGECTOMY (Bilateral) ANTERIOR (CYSTOCELE) AND POSTERIOR REPAIR (RECTOCELE) (N/A) Attempted TRANSVAGINAL TAPE (TVT) PROCEDURE, Cystotomy CYSTOSCOPY (N/A): stable and progressing well  Plan: Advance diet Encourage ambulation, PCA for pain control  LOS: 0 days    Bovard-Stuckert, Malahki Gasaway 04/29/2016, 5:09 PM

## 2016-04-29 NOTE — Brief Op Note (Signed)
04/29/2016  9:16 PM  PATIENT:  [name]  45 y.o. female  PRE-OPERATIVE DIAGNOSIS:  relaxation of pelvic floor, stress incontinence, dysmenorrhea  POST-OPERATIVE DIAGNOSIS:  relaxation of pelvic floor, stress incontinence, dysmenorrhea  PROCEDURE:  Procedure(s): LAPAROSCOPIC ASSISTED VAGINAL HYSTERECTOMY WITH Bilateral  SALPINGECTOMY (Bilateral) ANTERIOR (CYSTOCELE) AND POSTERIOR REPAIR (RECTOCELE) (N/A) Attempted TRANSVAGINAL TAPE (TVT) PROCEDURE, Cystotomy CYSTOSCOPY (N/A)  SURGEON:  Surgeon(s) and Role: Panel 1:    * Janyth Contes, MD - Primary    * Cheri Fowler, MD - Assisting  Panel 2:    * Cheri Fowler, MD - Primary    * Janyth Contes, MD - Assisting  ANESTHESIA:   local and general  EBL: 600cc  BLOOD ADMINISTERED:none  DRAINS: Urinary Catheter (Foley)   LOCAL MEDICATIONS USED:  MARCAINE     SPECIMEN:  Source of Specimen:  uterus, cervix, B tubes  DISPOSITION OF SPECIMEN:  PATHOLOGY  COUNTS:  YES  TOURNIQUET:  * No tourniquets in log *  DICTATION: .Other Dictation: Dictation Number no number given  PLAN OF CARE: Admit for overnight observation  PATIENT DISPOSITION:  PACU - hemodynamically stable.   Delay start of Pharmacological VTE agent (>24hrs) due to surgical blood loss or risk of bleeding: not applicable

## 2016-04-29 NOTE — Anesthesia Procedure Notes (Signed)
Procedure Name: Intubation Date/Time: 04/29/2016 7:15 AM Performed by: Georgeanne Nim Pre-anesthesia Checklist: Patient identified, Emergency Drugs available, Suction available, Patient being monitored and Timeout performed Patient Re-evaluated:Patient Re-evaluated prior to inductionOxygen Delivery Method: Circle system utilized Preoxygenation: Pre-oxygenation with 100% oxygen Intubation Type: IV induction Ventilation: Mask ventilation without difficulty Laryngoscope Size: Mac and 3 Grade View: Grade I Tube type: Oral Tube size: 7.0 mm Number of attempts: 1 Airway Equipment and Method: Stylet Placement Confirmation: ETT inserted through vocal cords under direct vision,  positive ETCO2,  CO2 detector and breath sounds checked- equal and bilateral Secured at: 21 cm Tube secured with: Tape Dental Injury: Teeth and Oropharynx as per pre-operative assessment

## 2016-04-29 NOTE — Progress Notes (Signed)
Patient ID: [name], female   DOB: Sep 15, 1970, 45 y.o.   MRN: [id number] Late entry  Pt was seen and appeared to be doing well. Family at bedside. Had tolerated some broth with only hearburn. Pain well controlled - just mild soreness over abdomen as expected. Voiding well per foley  VSS ABD- non distended GU - 500cc clear urine in foley bag  A/P: POD #0 S/p LAVH, ant/post repair, sling - stable         Routine post op care

## 2016-04-30 ENCOUNTER — Encounter (HOSPITAL_COMMUNITY): Payer: Self-pay | Admitting: Obstetrics and Gynecology

## 2016-04-30 DIAGNOSIS — N393 Stress incontinence (female) (male): Secondary | ICD-10-CM | POA: Diagnosis not present

## 2016-04-30 LAB — BASIC METABOLIC PANEL
Anion gap: 8 (ref 5–15)
BUN: <5 mg/dL — ABNORMAL LOW (ref 6–20)
CO2: 29 mmol/L (ref 22–32)
Calcium: 8.1 mg/dL — ABNORMAL LOW (ref 8.9–10.3)
Chloride: 102 mmol/L (ref 101–111)
Creatinine, Ser: 0.46 mg/dL (ref 0.44–1.00)
GFR calc Af Amer: >60 mL/min (ref >60)
GFR calc non Af Amer: >60 mL/min (ref >60)
Glucose, Bld: 120 mg/dL — ABNORMAL HIGH (ref 65–99)
Potassium: 3.8 mmol/L (ref 3.5–5.1)
Sodium: 139 mmol/L (ref 135–145)

## 2016-04-30 LAB — CBC
HCT: 33.4 % — ABNORMAL LOW (ref 36.0–46.0)
Hemoglobin: 11.2 g/dL — ABNORMAL LOW (ref 12.0–15.0)
MCH: 31.7 pg (ref 26.0–34.0)
MCHC: 33.5 g/dL (ref 30.0–36.0)
MCV: 94.6 fL (ref 78.0–100.0)
Platelets: 191 10*3/uL (ref 150–400)
RBC: 3.53 MIL/uL — ABNORMAL LOW (ref 3.87–5.11)
RDW: 15.3 % (ref 11.5–15.5)
WBC: 9.6 10*3/uL (ref 4.0–10.5)

## 2016-04-30 MED ORDER — NITROFURANTOIN MACROCRYSTAL 100 MG PO CAPS
100.0000 mg | ORAL_CAPSULE | Freq: Every morning | ORAL | Status: DC
  Filled 2016-04-30: qty 1

## 2016-04-30 MED ORDER — OXYCODONE-ACETAMINOPHEN 5-325 MG PO TABS: 1.0000 | ORAL_TABLET | Freq: Four times a day (QID) | ORAL | 0 refills | Status: DC | PRN

## 2016-04-30 MED ORDER — NITROFURANTOIN MACROCRYSTAL 100 MG PO CAPS: 100.0000 mg | ORAL_CAPSULE | Freq: Every morning | ORAL | 0 refills | Status: DC

## 2016-04-30 MED ORDER — IBUPROFEN 800 MG PO TABS: 800.0000 mg | ORAL_TABLET | Freq: Three times a day (TID) | ORAL | 1 refills | Status: DC | PRN

## 2016-04-30 NOTE — Progress Notes (Signed)
1 Day Post-Op Procedure(s) (LRB): LAPAROSCOPIC ASSISTED VAGINAL HYSTERECTOMY WITH Bilateral  SALPINGECTOMY (Bilateral) ANTERIOR (CYSTOCELE) AND POSTERIOR REPAIR (RECTOCELE) (N/A) Attempted TRANSVAGINAL TAPE (TVT) PROCEDURE, Cystotomy CYSTOSCOPY (N/A)  Subjective: Patient reports incisional pain and tolerating PO.      Objective: I have reviewed patient's vital signs, intake and output, medications and labs.  General: alert and no distress Resp: clear to auscultation bilaterally Cardio: regular rate and rhythm GI: soft, non-tender; bowel sounds normal; no masses,  no organomegaly Extremities: extremities normal, atraumatic, no cyanosis or edema Inc: C/D/I  Assessment: s/p Procedure(s): LAPAROSCOPIC ASSISTED VAGINAL HYSTERECTOMY WITH Bilateral  SALPINGECTOMY (Bilateral) ANTERIOR (CYSTOCELE) AND POSTERIOR REPAIR (RECTOCELE) (N/A) Attempted TRANSVAGINAL TAPE (TVT) PROCEDURE, Cystotomy CYSTOSCOPY (N/A): stable and progressing well  Plan: Encourage ambulation Advance to PO medication Discontinue IV fluids Discharge home when meeting guidelines, advise on leg bag  LOS: 0 days    Bovard-Stuckert, Thang Flett 04/30/2016, 8:13 AM

## 2016-04-30 NOTE — Progress Notes (Signed)
Patient instructed on the use of the foley leg bag for home use. Patient demonstrated appropriate usage and emptying. Home perineal care discussed. No questions at this time.

## 2016-04-30 NOTE — Discharge Summary (Signed)
Physician Discharge Summary  Patient ID: [name] MRN: [id number] DOB/AGE: [birth year] 45 y.o.  Admit date: 04/29/2016 Discharge date: 04/30/2016  Admission Diagnoses: pelvic relaxation and dysmenorrhea  Discharge Diagnoses:  Active Problems:   S/P laparoscopic assisted vaginal hysterectomy (LAVH) Anterior and Posterior repair, attempted TVT  Discharged Condition: good  Hospital Course: Admitted 12/11 for surgery.  LAVH/BS/Anterior and Posterior repair; Also attempted TVT.  Hgb decreased appropriately, well-tolerated.  Cr stable.  To be d/c with foley and leg bag.  To d/c in 1 week.  F/u in 2 week for check.  D/C with Motrin and Percocet  Consults: None  Significant Diagnostic Studies: labs: CBC, BMP  Treatments: IV hydration and analgesia: Dilaudid  Discharge Exam: Blood pressure 127/74, pulse 66, temperature 98.7 F (37.1 C), temperature source Oral, resp. rate 16, last menstrual period 04/26/2016, SpO2 95 %. General appearance: alert and no distress Resp: clear to auscultation bilaterally Cardio: regular rate and rhythm GI: soft, non-tender; bowel sounds normal; no masses,  no organomegaly Extremities: extremities normal, atraumatic, no cyanosis or edema Incision/Wound:C/D/I  Disposition: 01-Home or Self Care  Discharge Instructions    Call MD for:  persistant nausea and vomiting    Complete by:  As directed    Call MD for:  redness, tenderness, or signs of infection (pain, swelling, redness, odor or green/yellow discharge around incision site)    Complete by:  As directed    Call MD for:  severe uncontrolled pain    Complete by:  As directed    Diet - low sodium heart healthy    Complete by:  As directed    Discharge instructions    Complete by:  As directed    Call [phone] with questions or problems   Driving Restrictions    Complete by:  As directed    While taking strong pain medicine   Increase activity slowly    Complete by:  As directed    Lifting restrictions    Complete by:  As directed    No greater than 10-15 lbs for 6 weeks   May shower / Bathe    Complete by:  As directed    May walk up steps    Complete by:  As directed    Sexual Activity Restrictions    Complete by:  As directed    Pelvic rest - no douching, tampons or sex for 6 weeks       Medication List    TAKE these medications   albuterol 108 (90 Base) MCG/ACT inhaler Commonly known as:  PROVENTIL HFA;VENTOLIN HFA Inhale 2 puffs into the lungs every 6 (six) hours as needed for wheezing or shortness of breath.   fludrocortisone 0.1 MG tablet Commonly known as:  FLORINEF Take 1 tablet (0.1 mg total) by mouth daily.   ibuprofen 800 MG tablet Commonly known as:  ADVIL,MOTRIN Take 1 tablet (800 mg total) by mouth every 8 (eight) hours as needed (mild pain).   nitrofurantoin 100 MG capsule Commonly known as:  MACRODANTIN Take 1 capsule (100 mg total) by mouth every morning.   oxyCODONE-acetaminophen 5-325 MG tablet Commonly known as:  PERCOCET/ROXICET Take 1-2 tablets by mouth every 6 (six) hours as needed for severe pain (moderate to severe pain (when tolerating fluids)).      Follow-up Information    Bovard-Stuckert, Iktan Aikman, MD. Schedule an appointment as soon as possible for a visit in 2 week(s).   Specialty:  Obstetrics and Gynecology Why:  For incision check, 6 weeks  for full post-op check Contact information: [address] [place] [address] [phone]           Signed: Janyth Contes 04/30/2016, 8:42 AM

## 2016-04-30 NOTE — Progress Notes (Signed)
POD #1 attempted TVT with cystotomy Discussed importance of keeping catheter in for an entire week, showed her how to remove catheter in a week Will need to be on daily Macrodantin while has catheter in, order done

## 2016-04-30 NOTE — Progress Notes (Signed)
Patient discharged home with mother and sister. Discharge paperwork, instructions, follow-up appts, and prescriptions reviewed with patient. No questions at this time.

## 2016-05-01 NOTE — Op Note (Signed)
NAME:  [name], [name]                     ACCOUNT NO.:  MEDICAL RECORD NO.:  [id number]  LOCATION:                                 FACILITY:  PHYSICIAN:  Thornell Sartorius, MD             DATE OF BIRTH:  DATE OF PROCEDURE:  04/29/2016 DATE OF DISCHARGE:                              OPERATIVE REPORT   PREOPERATIVE DIAGNOSES:  Relaxation of pelvic floor, stress incontinence, dysmenorrhea.  POSTOPERATIVE DIAGNOSES:  Relaxation of pelvic floor, stress incontinence, dysmenorrhea.  PROCEDURE:  Laparoscopic-assisted vaginal hysterectomy with bilateral salpingectomy, anterior and posterior repair, attempted transvaginal tape procedure, cystoscopy.  SURGEON:  Thornell Sartorius, MD.  ASSISTANT:  Cheri Fowler, M.D. For the attempted sling procedure, Dr. Willis Modena is the primary surgeon.  ANESTHESIA:  Local anesthesia and general anesthesia.  ESTIMATED BLOOD LOSS:  Approximately 600 mL.  URINE OUTPUT:  Bloody urine during the case, foley placed.  COMPLICATIONS:  TVT procedure with bladder injury.  For further information, please refer to Dr. Paulene Floor dictated note.  PATHOLOGY:  Uterus, tubes, and cervix to pathology.  DESCRIPTION OF PROCEDURE:  After informed consent was reviewed with the patient and her sister, she was transported to the operating room in stable condition.  She was then placed on the bed in supine position. She was placed in the [place]in stirrups to assure her comfort given that she is having some issue with back spasm.  General anesthesia was then induced and found to be adequate.  She was prepped in the normal sterile fashion.  In and out catheter were performed.  Using an open-sided speculum, a Hulka manipulator was placed through the cervix and instruments were removed from her vagina.    Gloves and gown were changed.  Attention was turned to the abdominal portion of the case.  At the level of her umbilical incision from her previous panniculectomy, an  approximately 1 cm infraumbilical incision was made with the varies needle.  Pneumoperitoneum was obtained after passing the hanging drop test.  The trocar was placed under direct visualization. Accessory ports were placed on both the right and left under direct visualization.  Pelvic survey revealed a normal-appearing uterus and tubes that had been previously ligated.  Normal-appearing ovaries.  At the time of the placement of the uterine manipulator, she was noted to have an approximately 3+ anterior vaginal defect and approximately 2 to 3+ posterior Vaginal defect as well.  Attention was turned to excising the tube.  The right tube was grasped with a million-dollar grasper and using a Harmonic scalpel The distal tube was excised and placed in the posterior cul-de-sac.  From this point, the round ligaments and cardinal ligaments were ligated using the Harmonic scalpel.  This was noted to be hemostatic.  Utero-ovarian pedicle was also created.  The broad ligaments were taken down to the point of the uterine artery.  A bladder flap was created to the midline in similar fashion.  On the left, the distal portion of the tube was excised using Harmonic scalpel, placed in the posterior cul-de-sac.  The round ligament and utero-ovarian pedicles were created.  The broad ligaments were ligated  proximally to the uterine artery.  The bladder flap was created to the midline.  Pedicles were noted to be hemostatic. Attention was turned to the vaginal portion of the case.  Using a heavy weighted speculum and a Deaver retractor, her cervix was easily visualized.  Vasopressin was injected and the cervix was circumscribed with Bovie cautery and attempt to enter anteriorly was unsuccessful. The posterior cul-de-sac was then entered and the banana speculum was placed.  The uterosacral ligaments were ligated and held in a stepwise fashion, ligaments to the level of the uterine artery were ligated.  The uterus  was delivered.  The tubes were removed from the posterior cul-de- sac.  The anterior repair was performed.  Mucosa was injected with vasopressin mixture.  The mucosa was undermined and the underlying level was cleared to better reapproximate the underlying fascia The mucosa cleared off and the fascia was reapproximated with 2-0 Vicryl.  The mucosa was trimmed and reapproximated with 2-0 Vicryl.  There was some bleeding at the anterior aspect of the cuff.  This was controlled with sutures of 3-0 Vicryl and Bovie cautery.  An attempt was made per Dr. Paulene Floor dictated Note to place a midurethral sling.  The defect in the mucosa was closed with 2-0 Vicryl in running fashion as well as an additional area that was also closed with 2-0 Vicryl.  The uterosacral ligaments were plicated and then the held sutures were tied together as well.  The posterior repair was performed. The mucosa was undermined, and the fascia was cleaned and reapproximated using 2-0 Vicryl.  The mucosa was trimmed and the mucosa was reapproximated with 2-0 Vicryl in a running locked fashion.  The intervening portion between the area of the anterior repair and the start of the posterior repair was closed with 2-0 Vicryl in a running locked fashion.  Foley catheter was placed.  The vagina was packed with 2-inch packing with Estrace cream.  Gloves and gown were changed. Attention was turned back to the abdominal portion of the case and was noted to be hemostatic.  Irrigation was performed.  The pedicles all appeared dry.  The trocars were removed under direct visualization.  The pneumoperitoneum was evacuated.  Three 5 mm ports were closed with a subcuticular suture as well as Dermabond.  The sterile incisions from the TVT attempt were also closed with Dermabond.  The patient tolerated the procedure well.  Sponge, lap, and needle counts were correct x2. Given the blood loss was 600 mL, she was given an additional dose  of Ancef.  Otherwise, the patient tolerated the procedure well and was transported to the PACU in stable condition.     Thornell Sartorius, MD     JB/MEDQ  D:  04/29/2016  T:  04/30/2016  Job:  [id number]

## 2016-05-14 ENCOUNTER — Other Ambulatory Visit: Payer: Self-pay | Admitting: Family Medicine

## 2016-05-22 ENCOUNTER — Encounter: Payer: Self-pay | Admitting: Endocrinology

## 2016-05-26 NOTE — Progress Notes (Signed)
Subjective:    Patient ID: [name], female    DOB: August 16, 1970, 46 y.o.   MRN: [id number]  HPI Pt returns for f/u of hypocortisolism (she has taken courses of steroids, but she says the last was in early 2017; no other cause was found; ACTH stim test was subnormal in Nov of 2017; she has had TL).  No recent steroids.  She has slight muscle cramps of the fingers, and assoc dry mouth.  She had TAH last month.   Past Medical History:  Diagnosis Date  . Asthma    allergy, exercise induced  . Atrial tachycardia ([place])   . Decreased cortisol level ([place])   . Lethargic   . Ovarian cyst   . PONV (postoperative nausea and vomiting)     Past Surgical History:  Procedure Laterality Date  . ablasion    . ANTERIOR AND POSTERIOR REPAIR N/A 04/29/2016   Procedure: ANTERIOR (CYSTOCELE) AND POSTERIOR REPAIR (RECTOCELE);  Surgeon: Janyth Contes, MD;  Location: [facility];  Service: Gynecology;  Laterality: N/A;  . ANTERIOR CRUCIATE LIGAMENT REPAIR Left   . BLADDER SUSPENSION  04/29/2016   Procedure: Attempted TRANSVAGINAL TAPE (TVT) PROCEDURE, Cystotomy;  Surgeon: Cheri Fowler, MD;  Location: [facility];  Service: Gynecology;;  . BREAST SURGERY     augmentation  . CARDIAC ELECTROPHYSIOLOGY STUDY AND ABLATION     twice  . CYSTOSCOPY N/A 04/29/2016   Procedure: CYSTOSCOPY;  Surgeon: Cheri Fowler, MD;  Location: [facility];  Service: Gynecology;  Laterality: N/A;  . LAPAROSCOPIC VAGINAL HYSTERECTOMY WITH SALPINGECTOMY Bilateral 04/29/2016   Procedure: LAPAROSCOPIC ASSISTED VAGINAL HYSTERECTOMY WITH Bilateral  SALPINGECTOMY;  Surgeon: Janyth Contes, MD;  Location: [facility];  Service: Gynecology;  Laterality: Bilateral;  . MYRINGOTOMY    . NASAL SEPTUM SURGERY    . TONSILLECTOMY AND ADENOIDECTOMY    . TUBAL LIGATION    . WISDOM TOOTH EXTRACTION      Social History   Social History  . Marital status: Divorced    Spouse name: N/A  . Number of children: N/A  . Years of education: N/A    Occupational History  . Not on file.   Social History Main Topics  . Smoking status: Never Smoker  . Smokeless tobacco: Never Used  . Alcohol use Yes  . Drug use: No  . Sexual activity: Not on file   Other Topics Concern  . Not on file   Social History Narrative  . No narrative on file    Current Outpatient Prescriptions on File Prior to Visit  Medication Sig Dispense Refill  . albuterol (PROVENTIL HFA;VENTOLIN HFA) 108 (90 Base) MCG/ACT inhaler Inhale 2 puffs into the lungs every 6 (six) hours as needed for wheezing or shortness of breath. 1 Inhaler 2  . fludrocortisone (FLORINEF) 0.1 MG tablet Take 1 tablet (0.1 mg total) by mouth daily. 30 tablet 2  . ibuprofen (ADVIL,MOTRIN) 800 MG tablet Take 1 tablet (800 mg total) by mouth every 8 (eight) hours as needed (mild pain). 45 tablet 1  . oxyCODONE-acetaminophen (PERCOCET/ROXICET) 5-325 MG tablet Take 1-2 tablets by mouth every 6 (six) hours as needed for severe pain (moderate to severe pain (when tolerating fluids)). 30 tablet 0  . nitrofurantoin (MACRODANTIN) 100 MG capsule Take 1 capsule (100 mg total) by mouth every morning. 6 capsule 0   No current facility-administered medications on file prior to visit.     Allergies  Allergen Reactions  . Demerol Nausea And Vomiting and Swelling    No  family history on file.  BP 136/80   Pulse 93   Ht 5\' 5"  (1.651 m)   Wt 149 lb (67.6 kg)   SpO2 97%   BMI 24.79 kg/m    Review of Systems She has gained weight.  Heartburn is resolved.  She still has lightheadedness    Objective:   Physical Exam VITAL SIGNS:  See vs page GENERAL: no distress Ext: trace bilat leg edema.    ACTH stimulation test is done: baseline cortisol level=0.2 then Cosyntropin 250 mcg is given im 45 minutes later, cortisol level=1.9 (normal response)     Assessment & Plan:  Hypocortisolism, persistent.  uncertain etiology.  She has no clinical indication of this.  I advised ref to a tertiary  center. Weight gain: reduce florinef Cramps, new: check vit-D and PTH  Patient is advised the following: Patient Instructions  blood tests are requested for you today.  We'll let you know about the results.

## 2016-05-27 ENCOUNTER — Encounter: Payer: Self-pay | Admitting: Endocrinology

## 2016-05-27 ENCOUNTER — Ambulatory Visit (INDEPENDENT_AMBULATORY_CARE_PROVIDER_SITE_OTHER): Payer: Managed Care, Other (non HMO) | Admitting: Endocrinology

## 2016-05-27 VITALS — BP 136/80 | HR 93 | Ht 65.0 in | Wt 149.0 lb

## 2016-05-27 DIAGNOSIS — E230 Hypopituitarism: Secondary | ICD-10-CM | POA: Diagnosis not present

## 2016-05-27 DIAGNOSIS — R739 Hyperglycemia, unspecified: Secondary | ICD-10-CM | POA: Diagnosis not present

## 2016-05-27 DIAGNOSIS — D649 Anemia, unspecified: Secondary | ICD-10-CM

## 2016-05-27 DIAGNOSIS — E2749 Other adrenocortical insufficiency: Secondary | ICD-10-CM | POA: Diagnosis not present

## 2016-05-27 LAB — CBC WITH DIFFERENTIAL/PLATELET
Basophils Absolute: 0.1 10*3/uL (ref 0.0–0.1)
Basophils Relative: 0.6 % (ref 0.0–3.0)
Eosinophils Absolute: 0 10*3/uL (ref 0.0–0.7)
Eosinophils Relative: 0.5 % (ref 0.0–5.0)
HCT: 36.1 % (ref 36.0–46.0)
Hemoglobin: 12.3 g/dL (ref 12.0–15.0)
Lymphocytes Relative: 4.3 % — ABNORMAL LOW (ref 12.0–46.0)
Lymphs Abs: 0.5 10*3/uL — ABNORMAL LOW (ref 0.7–4.0)
MCHC: 34.2 g/dL (ref 30.0–36.0)
MCV: 93.4 fl (ref 78.0–100.0)
Monocytes Absolute: 0.6 10*3/uL (ref 0.1–1.0)
Monocytes Relative: 5.7 % (ref 3.0–12.0)
Neutro Abs: 9.7 10*3/uL — ABNORMAL HIGH (ref 1.4–7.7)
Neutrophils Relative %: 88.9 % — ABNORMAL HIGH (ref 43.0–77.0)
Platelets: 258 10*3/uL (ref 150.0–400.0)
RBC: 3.87 Mil/uL (ref 3.87–5.11)
RDW: 14.8 % (ref 11.5–15.5)
WBC: 11 10*3/uL — ABNORMAL HIGH (ref 4.0–10.5)

## 2016-05-27 LAB — URINALYSIS, ROUTINE W REFLEX MICROSCOPIC
Bilirubin Urine: NEGATIVE
Ketones, ur: NEGATIVE
Leukocytes, UA: NEGATIVE
Nitrite: NEGATIVE
Specific Gravity, Urine: <=1.005 — AB (ref 1.000–1.030)
Total Protein, Urine: NEGATIVE
Urine Glucose: NEGATIVE
Urobilinogen, UA: 0.2 (ref 0.0–1.0)
pH: 5.5 (ref 5.0–8.0)

## 2016-05-27 LAB — IBC PANEL
Iron: 129 ug/dL (ref 42–145)
Saturation Ratios: 37 % (ref 20.0–50.0)
Transferrin: 249 mg/dL (ref 212.0–360.0)

## 2016-05-27 LAB — HEMOGLOBIN A1C: Hgb A1c MFr Bld: 4.8 % (ref 4.6–6.5)

## 2016-05-27 LAB — LUTEINIZING HORMONE: LH: 0.52 m[IU]/mL

## 2016-05-27 LAB — VITAMIN D 25 HYDROXY (VIT D DEFICIENCY, FRACTURES): VITD: 29.28 ng/mL — ABNORMAL LOW (ref 30.00–100.00)

## 2016-05-27 LAB — CORTISOL
Cortisol, Plasma: 0.2 ug/dL
Cortisol, Plasma: 1.9 ug/dL

## 2016-05-27 LAB — FOLLICLE STIMULATING HORMONE: FSH: 3.6 m[IU]/mL

## 2016-05-27 MED ORDER — COSYNTROPIN NICU IV SYRINGE 0.25 MG/ML (STANDARD DOSE)
0.2500 mg | Freq: Once | INTRAVENOUS | Status: AC
  Administered 2016-05-27: 0.25 mg via INTRAMUSCULAR

## 2016-05-27 NOTE — Patient Instructions (Addendum)
blood tests are requested for you today.  We'll let you know about the results.  

## 2016-05-28 ENCOUNTER — Ambulatory Visit (INDEPENDENT_AMBULATORY_CARE_PROVIDER_SITE_OTHER): Payer: Managed Care, Other (non HMO) | Admitting: Physician Assistant

## 2016-05-28 ENCOUNTER — Encounter: Payer: Self-pay | Admitting: Endocrinology

## 2016-05-28 VITALS — BP 134/85 | HR 88 | Temp 98.3°F | Wt 145.0 lb

## 2016-05-28 DIAGNOSIS — J069 Acute upper respiratory infection, unspecified: Secondary | ICD-10-CM

## 2016-05-28 DIAGNOSIS — R110 Nausea: Secondary | ICD-10-CM

## 2016-05-28 LAB — PTH, INTACT AND CALCIUM
Calcium: 8.7 mg/dL (ref 8.6–10.2)
PTH: 22 pg/mL (ref 14–64)

## 2016-05-28 LAB — POCT INFLUENZA A/B
Influenza A, POC: NEGATIVE
Influenza B, POC: NEGATIVE

## 2016-05-28 MED ORDER — OSELTAMIVIR PHOSPHATE 75 MG PO CAPS: 75.0000 mg | ORAL_CAPSULE | Freq: Two times a day (BID) | ORAL | 0 refills | Status: DC

## 2016-05-28 MED ORDER — IPRATROPIUM BROMIDE 0.06 % NA SOLN: 1.0000 | Freq: Four times a day (QID) | NASAL | 0 refills | Status: DC | PRN

## 2016-05-28 MED ORDER — IPRATROPIUM-ALBUTEROL 0.5-2.5 (3) MG/3ML IN SOLN
3.0000 mL | Freq: Once | RESPIRATORY_TRACT | Status: AC
  Administered 2016-05-28: 3 mL via RESPIRATORY_TRACT

## 2016-05-28 MED ORDER — BENZONATATE 200 MG PO CAPS: 200.0000 mg | ORAL_CAPSULE | Freq: Three times a day (TID) | ORAL | 0 refills | Status: DC | PRN

## 2016-05-28 MED ORDER — ONDANSETRON 4 MG PO TBDP: 4.0000 mg | ORAL_TABLET | Freq: Four times a day (QID) | ORAL | 0 refills | Status: DC | PRN

## 2016-05-28 MED ORDER — FLUDROCORTISONE ACETATE 0.1 MG PO TABS: 0.1000 mg | ORAL_TABLET | ORAL | 2 refills | Status: DC

## 2016-05-28 NOTE — Patient Instructions (Addendum)
Zofran dissolved under the tongue as needed for nausea / vomiting Nasal spray up to 4 times daily Continue using your Albuterol inhaler for wheezing and bronchospasm Tylenol or Advil as needed for headaches and muscle aches Push fluids You should start to feel improved within 1 week. It is possible to have a cough without systemic systems for up to 2-3 weeks following an upper respiratory infection. Please return if no improvement in 1 week.   Upper Respiratory Infection, Adult Most upper respiratory infections (URIs) are a viral infection of the air passages leading to the lungs. A URI affects the nose, throat, and upper air passages. The most common type of URI is nasopharyngitis and is typically referred to as "the common cold." URIs run their course and usually go away on their own. Most of the time, a URI does not require medical attention, but sometimes a bacterial infection in the upper airways can follow a viral infection. This is called a secondary infection. Sinus and middle ear infections are common types of secondary upper respiratory infections. Bacterial pneumonia can also complicate a URI. A URI can worsen asthma and chronic obstructive pulmonary disease (COPD). Sometimes, these complications can require emergency medical care and may be life threatening. What are the causes? Almost all URIs are caused by viruses. A virus is a type of germ and can spread from one person to another. What increases the risk? You may be at risk for a URI if:  You smoke.  You have chronic heart or lung disease.  You have a weakened defense (immune) system.  You are very young or very old.  You have nasal allergies or asthma.  You work in crowded or poorly ventilated areas.  You work in health care facilities or schools. What are the signs or symptoms? Symptoms typically develop 2-3 days after you come in contact with a cold virus. Most viral URIs last 7-10 days. However, viral URIs from the  influenza virus (flu virus) can last 14-18 days and are typically more severe. Symptoms may include:  Runny or stuffy (congested) nose.  Sneezing.  Cough.  Sore throat.  Headache.  Fatigue.  Fever.  Loss of appetite.  Pain in your forehead, behind your eyes, and over your cheekbones (sinus pain).  Muscle aches. How is this diagnosed? Your health care provider may diagnose a URI by:  Physical exam.  Tests to check that your symptoms are not due to another condition such as:  Strep throat.  Sinusitis.  Pneumonia.  Asthma. How is this treated? A URI goes away on its own with time. It cannot be cured with medicines, but medicines may be prescribed or recommended to relieve symptoms. Medicines may help:  Reduce your fever.  Reduce your cough.  Relieve nasal congestion. Follow these instructions at home:  Take medicines only as directed by your health care provider.  Gargle warm saltwater or take cough drops to comfort your throat as directed by your health care provider.  Use a warm mist humidifier or inhale steam from a shower to increase air moisture. This may make it easier to breathe.  Drink enough fluid to keep your urine clear or pale yellow.  Eat soups and other clear broths and maintain good nutrition.  Rest as needed.  Return to work when your temperature has returned to normal or as your health care provider advises. You may need to stay home longer to avoid infecting others. You can also use a face mask and careful hand washing  to prevent spread of the virus.  Increase the usage of your inhaler if you have asthma.  Do not use any tobacco products, including cigarettes, chewing tobacco, or electronic cigarettes. If you need help quitting, ask your health care provider. How is this prevented? The best way to protect yourself from getting a cold is to practice good hygiene.  Avoid oral or hand contact with people with cold symptoms.  Wash your  hands often if contact occurs. There is no clear evidence that vitamin C, vitamin E, echinacea, or exercise reduces the chance of developing a cold. However, it is always recommended to get plenty of rest, exercise, and practice good nutrition. Contact a health care provider if:  You are getting worse rather than better.  Your symptoms are not controlled by medicine.  You have chills.  You have worsening shortness of breath.  You have brown or red mucus.  You have yellow or brown nasal discharge.  You have pain in your face, especially when you bend forward.  You have a fever.  You have swollen neck glands.  You have pain while swallowing.  You have white areas in the back of your throat. Get help right away if:  You have severe or persistent:  Headache.  Ear pain.  Sinus pain.  Chest pain.  You have chronic lung disease and any of the following:  Wheezing.  Prolonged cough.  Coughing up blood.  A change in your usual mucus.  You have a stiff neck.  You have changes in your:  Vision.  Hearing.  Thinking.  Mood. This information is not intended to replace advice given to you by your health care provider. Make sure you discuss any questions you have with your health care provider. Document Released: 10/30/2000 Document Revised: 01/07/2016 Document Reviewed: 08/11/2013 Elsevier Interactive Patient Education  2017 [employer].

## 2016-05-28 NOTE — Progress Notes (Signed)
HPI:                                                                [name] is a 46 y.o. female who presents to [facility] today for flu-like symptoms  Patient c/o headache, body aches, chills, cough, and rhinorrhea x 1 day. Symptoms developed suddenly last night. She is taking Ibuprofen for headache and body aches, which helps. Endorses nausea and dry heaves, but denies abdominal pain.   Patient feels strongly that she has the flu. Patient tested positive for flu last year, but was outside of the window for Tamiflu. Did not receive influenza vaccine this season due to a prior illness and says she was told she had to wait until February.  PMHx significant for hypocortisolemia. Patient was seen by her endocrinologist yesterday. Found to have a mild leukocytosis with left shift.   Health Maintenance Health Maintenance  Topic Date Due  . HIV Screening  01/31/1987  . TETANUS/TDAP  01/31/1991  . INFLUENZA VACCINE  06/25/2016 (Originally 12/19/2015)  . PAP SMEAR  01/23/2019    Past Medical History:  Diagnosis Date  . Asthma    allergy, exercise induced  . Atrial tachycardia ([place])   . Decreased cortisol level ([place])   . Lethargic   . Ovarian cyst   . PONV (postoperative nausea and vomiting)    Past Surgical History:  Procedure Laterality Date  . ablasion    . ANTERIOR AND POSTERIOR REPAIR N/A 04/29/2016   Procedure: ANTERIOR (CYSTOCELE) AND POSTERIOR REPAIR (RECTOCELE);  Surgeon: Janyth Contes, MD;  Location: [facility];  Service: Gynecology;  Laterality: N/A;  . ANTERIOR CRUCIATE LIGAMENT REPAIR Left   . BLADDER SUSPENSION  04/29/2016   Procedure: Attempted TRANSVAGINAL TAPE (TVT) PROCEDURE, Cystotomy;  Surgeon: Cheri Fowler, MD;  Location: [facility];  Service: Gynecology;;  . BREAST SURGERY     augmentation  . CARDIAC ELECTROPHYSIOLOGY STUDY AND ABLATION     twice  . CYSTOSCOPY N/A 04/29/2016   Procedure: CYSTOSCOPY;   Surgeon: Cheri Fowler, MD;  Location: [facility];  Service: Gynecology;  Laterality: N/A;  . LAPAROSCOPIC VAGINAL HYSTERECTOMY WITH SALPINGECTOMY Bilateral 04/29/2016   Procedure: LAPAROSCOPIC ASSISTED VAGINAL HYSTERECTOMY WITH Bilateral  SALPINGECTOMY;  Surgeon: Janyth Contes, MD;  Location: [facility];  Service: Gynecology;  Laterality: Bilateral;  . MYRINGOTOMY    . NASAL SEPTUM SURGERY    . TONSILLECTOMY AND ADENOIDECTOMY    . TUBAL LIGATION    . WISDOM TOOTH EXTRACTION     Social History  Substance Use Topics  . Smoking status: Never Smoker  . Smokeless tobacco: Never Used  . Alcohol use Yes   family history is not on file.  Review of Systems  Constitutional: Positive for chills and malaise/fatigue.  HENT: Positive for congestion. Negative for ear pain and sore throat.   Respiratory: Positive for cough and wheezing. Negative for sputum production and shortness of breath.   Cardiovascular: Negative for chest pain and palpitations.  Gastrointestinal: Positive for nausea. Negative for diarrhea.  Musculoskeletal: Positive for myalgias.  Skin: Negative for rash.  Neurological: Positive for headaches. Negative for dizziness.     Medications: Current Outpatient Prescriptions  Medication Sig Dispense Refill  . albuterol (PROVENTIL HFA;VENTOLIN HFA) 108 (90 Base)  MCG/ACT inhaler Inhale 2 puffs into the lungs every 6 (six) hours as needed for wheezing or shortness of breath. 1 Inhaler 2  . fludrocortisone (FLORINEF) 0.1 MG tablet Take 1 tablet (0.1 mg total) by mouth daily. 30 tablet 2  . ibuprofen (ADVIL,MOTRIN) 800 MG tablet Take 1 tablet (800 mg total) by mouth every 8 (eight) hours as needed (mild pain). 45 tablet 1  . nitrofurantoin (MACRODANTIN) 100 MG capsule Take 1 capsule (100 mg total) by mouth every morning. 6 capsule 0  . oxyCODONE-acetaminophen (PERCOCET/ROXICET) 5-325 MG tablet Take 1-2 tablets by mouth every 6 (six) hours as needed for severe pain (moderate to severe  pain (when tolerating fluids)). 30 tablet 0   No current facility-administered medications for this visit.    Allergies  Allergen Reactions  . Demerol Nausea And Vomiting and Swelling       Objective:  BP 134/85   Pulse 88   Temp 98.3 F (36.8 C) (Oral)   Wt 145 lb (65.8 kg)   BMI 24.13 kg/m  Gen: well-groomed, cooperative,  ill-appearing, not toxic appearing, no distress HEENT: normal conjunctiva, TM's clear, oropharynx clear, moist mucus membranes, no cervical or tonsillar adenopathy Lungs: Normal work of breathing, coarse breath sounds and expiratory wheezes right lung fields worse than left, no rales or rhonchi Heart: Normal rate, regular rhythm, s1 and s2 distinct, no murmur Abd: Soft. Nondistended, Nontender Neuro: alert and oriented x 3, EOM's intact Skin: warm and dry, no rashes or lesions on exposed skin   Results for orders placed or performed in visit on 05/27/16 (from the past 72 hour(s))  Urinalysis, Routine w reflex microscopic (not at [facility])     Status: Abnormal   Collection Time: 05/27/16  2:02 PM  Result Value Ref Range   Color, Urine YELLOW Yellow;Lt. Yellow   APPearance CLEAR Clear   Specific Gravity, Urine <=1.005 (A) 1.000 - 1.030   pH 5.5 5.0 - 8.0   Total Protein, Urine NEGATIVE Negative   Urine Glucose NEGATIVE Negative   Ketones, ur NEGATIVE Negative   Bilirubin Urine NEGATIVE Negative   Hgb urine dipstick TRACE-INTACT (A) Negative   Urobilinogen, UA 0.2 0.0 - 1.0   Leukocytes, UA NEGATIVE Negative   Nitrite NEGATIVE Negative   WBC, UA 0-2/hpf 0-2/hpf   RBC / HPF 0-2/hpf 0-2/hpf   Squamous Epithelial / LPF Rare(0-4/hpf) Rare(0-4/hpf)  Luteinizing hormone     Status: None   Collection Time: 05/27/16  2:02 PM  Result Value Ref Range   LH 0.52 mIU/mL    Comment: Female Reference Range:20-70 yrs     1.5-9.3 mIU/mL>70 yrs       3.1-35.6 mIU/mLFemale Reference Range:Follicular Phase     [id number] mIU/mLMidcycle             8.7-76.3 mIU/mLLuteal Phase          0.5-16.9 mIU/mL  Post Menopausal      15.9-54.0  mIU/mLPregnant             <1.5 mIU/mLContraceptives       [id number] mIU/mL   Follicle stimulating hormone     Status: None   Collection Time: 05/27/16  2:02 PM  Result Value Ref Range   FSH 3.6 mIU/ML    Comment: Female Reference Range:  1.4-18.1 mIU/mLFemale Reference Range:Follicular Phase          2.5-10.2 mIU/mLMidCycle Peak          3.4-33.4 mIU/mLLuteal Phase          1.5-9.1  mIU/mLPost Menopausal     23.0-116.3 mIU/mLPregnant          <0.3 mIU/mL  Cortisol     Status: None   Collection Time: 05/27/16  2:02 PM  Result Value Ref Range   Cortisol, Plasma 0.2 ug/dL    Comment: AM:  4.3 - 22.4 ug/dLPM:  3.1 - 16.7 ug/dL  Cortisol     Status: None   Collection Time: 05/27/16  2:48 PM  Result Value Ref Range   Cortisol, Plasma 1.9 ug/dL    Comment: AM:  4.3 - 22.4 ug/dLPM:  3.1 - 16.7 ug/dL  VITAMIN D 25 Hydroxy (Vit-D Deficiency, Fractures)     Status: Abnormal   Collection Time: 05/27/16  2:48 PM  Result Value Ref Range   VITD 29.28 (L) 30.00 - 100.00 ng/mL  Hemoglobin A1c     Status: None   Collection Time: 05/27/16  2:48 PM  Result Value Ref Range   Hgb A1c MFr Bld 4.8 4.6 - 6.5 %    Comment: Glycemic Control Guidelines for People with Diabetes:Non Diabetic:  <6%Goal of Therapy: <7%Additional Action Suggested:  >8%   CBC with Differential/Platelet     Status: Abnormal   Collection Time: 05/27/16  2:48 PM  Result Value Ref Range   WBC 11.0 (H) 4.0 - 10.5 K/uL   RBC 3.87 3.87 - 5.11 Mil/uL   Hemoglobin 12.3 12.0 - 15.0 g/dL   HCT 36.1 36.0 - 46.0 %   MCV 93.4 78.0 - 100.0 fl   MCHC 34.2 30.0 - 36.0 g/dL   RDW 14.8 11.5 - 15.5 %   Platelets 258.0 150.0 - 400.0 K/uL   Neutrophils Relative % 88.9 (H) 43.0 - 77.0 %   Lymphocytes Relative 4.3 (L) 12.0 - 46.0 %   Monocytes Relative 5.7 3.0 - 12.0 %   Eosinophils Relative 0.5 0.0 - 5.0 %   Basophils Relative 0.6 0.0 - 3.0 %   Neutro Abs 9.7 (H) 1.4 - 7.7 K/uL   Lymphs Abs  0.5 (L) 0.7 - 4.0 K/uL   Monocytes Absolute 0.6 0.1 - 1.0 K/uL   Eosinophils Absolute 0.0 0.0 - 0.7 K/uL   Basophils Absolute 0.1 0.0 - 0.1 K/uL  IBC panel     Status: None   Collection Time: 05/27/16  2:48 PM  Result Value Ref Range   Iron 129 42 - 145 ug/dL   Transferrin 249.0 212.0 - 360.0 mg/dL   Saturation Ratios 37.0 20.0 - 50.0 %   No results found.  I personally reviewed patient's labs from yesterday, which show leukocytosis with left shift.  Assessment and Plan: 46 y.o. female with   Acute upper respiratory infection - POCT influenza A/B negative, but patient has all of the symptoms of flu. She is within 24 hours since symptom onset, so will start Tamiflu. Instructed to have her household relatives treated as well - DuoNeb given in clinic today. Patient's wheezing improved. Cont Albuterol inhaler 1-2 puffs every 4 hours as needed for wheezing or bronchospasm. Avoiding steroids due to hypocortisolism.  - ipratropium (ATROVENT) 0.06 % nasal spray; Place 1 spray into both nostrils 4 (four) times daily as needed for rhinitis.  Dispense: 15 mL; Refill: 0 - benzonatate (TESSALON) 200 MG capsule; Take 1 capsule (200 mg total) by mouth 3 (three) times daily as needed for cough.  Dispense: 45 capsule; Refill: 0 - oseltamivir (TAMIFLU) 75 MG capsule; Take 1 capsule (75 mg total) by mouth 2 (two) times daily.  Dispense: 10 capsule; Refill:  0 - ipratropium-albuterol (DUONEB) 0.5-2.5 (3) MG/3ML nebulizer solution 3 mL; Take 3 mLs by nebulization once.  Nausea without vomiting - ondansetron (ZOFRAN-ODT) 4 MG disintegrating tablet; Take 1 tablet (4 mg total) by mouth every 6 (six) hours as needed for nausea or vomiting.  Dispense: 20 tablet; Refill: 0  Patient education and anticipatory guidance given Patient agrees with treatment plan Follow-up as needed if symptoms worsen of fail to improve  Darlyne Russian PA-C

## 2016-05-29 ENCOUNTER — Other Ambulatory Visit: Payer: Managed Care, Other (non HMO)

## 2016-05-29 ENCOUNTER — Other Ambulatory Visit: Payer: Self-pay | Admitting: Endocrinology

## 2016-05-29 DIAGNOSIS — E2749 Other adrenocortical insufficiency: Secondary | ICD-10-CM

## 2016-05-31 ENCOUNTER — Other Ambulatory Visit: Payer: Managed Care, Other (non HMO)

## 2016-05-31 ENCOUNTER — Other Ambulatory Visit: Payer: Self-pay

## 2016-05-31 DIAGNOSIS — E2749 Other adrenocortical insufficiency: Secondary | ICD-10-CM

## 2016-06-03 LAB — ACTH: C206 ACTH: <5 pg/mL — ABNORMAL LOW (ref 6–50)

## 2016-06-04 ENCOUNTER — Encounter: Payer: Self-pay | Admitting: Endocrinology

## 2016-06-20 ENCOUNTER — Encounter: Payer: Self-pay | Admitting: Endocrinology

## 2016-06-24 ENCOUNTER — Telehealth: Payer: Self-pay

## 2016-06-24 NOTE — Telephone Encounter (Signed)
Despina Hick, Dr. Loanne Drilling wanted me to send you a message about a referral that was placed on 05/29/2016. The patient has not been contacted about this referral yet and would prefer the referral to be sent to [facility]. When you get the chance, could you look at this?  Thanks!

## 2016-08-06 ENCOUNTER — Encounter: Payer: Self-pay | Admitting: Family Medicine

## 2016-08-06 MED ORDER — SCOPOLAMINE 1 MG/3DAYS TD PT72: 1.0000 | MEDICATED_PATCH | TRANSDERMAL | 0 refills | Status: DC

## 2016-09-17 ENCOUNTER — Encounter: Payer: Self-pay | Admitting: Family Medicine

## 2016-09-17 DIAGNOSIS — L659 Nonscarring hair loss, unspecified: Secondary | ICD-10-CM

## 2016-09-19 MED ORDER — FERRALET 90 90-1 MG PO TABS: 1.0000 | ORAL_TABLET | Freq: Every day | ORAL | 1 refills | Status: DC

## 2016-09-19 NOTE — Addendum Note (Signed)
Addended by: [name] D on: 09/19/2016 12:20 PM   Modules accepted: Orders

## 2016-10-01 LAB — CBC WITH DIFFERENTIAL/PLATELET
Basophils Absolute: 54 cells/uL (ref 0–200)
Basophils Relative: 1 %
Eosinophils Absolute: 108 cells/uL (ref 15–500)
Eosinophils Relative: 2 %
HCT: 42.1 % (ref 35.0–45.0)
Hemoglobin: 13.8 g/dL (ref 11.7–15.5)
Lymphocytes Relative: 35 %
Lymphs Abs: 1890 cells/uL (ref 850–3900)
MCH: 30.1 pg (ref 27.0–33.0)
MCHC: 32.8 g/dL (ref 32.0–36.0)
MCV: 91.7 fL (ref 80.0–100.0)
MPV: 9.9 fL (ref 7.5–12.5)
Monocytes Absolute: 540 cells/uL (ref 200–950)
Monocytes Relative: 10 %
Neutro Abs: 2808 cells/uL (ref 1500–7800)
Neutrophils Relative %: 52 %
Platelets: 320 10*3/uL (ref 140–400)
RBC: 4.59 MIL/uL (ref 3.80–5.10)
RDW: 13.2 % (ref 11.0–15.0)
WBC: 5.4 10*3/uL (ref 3.8–10.8)

## 2016-10-01 LAB — VITAMIN B12: Vitamin B-12: 1454 pg/mL — ABNORMAL HIGH (ref 200–1100)

## 2016-10-01 LAB — FERRITIN: Ferritin: 35 ng/mL (ref 10–232)

## 2016-10-02 LAB — TSH: TSH: 1.75 mIU/L

## 2016-10-03 LAB — ZINC: Zinc: 101 ug/dL (ref 60–130)

## 2016-10-09 ENCOUNTER — Encounter: Payer: Self-pay | Admitting: Family Medicine

## 2016-11-01 ENCOUNTER — Encounter: Payer: Self-pay | Admitting: Family Medicine

## 2016-11-01 ENCOUNTER — Other Ambulatory Visit: Payer: Self-pay | Admitting: Family Medicine

## 2016-11-01 ENCOUNTER — Ambulatory Visit (INDEPENDENT_AMBULATORY_CARE_PROVIDER_SITE_OTHER): Payer: Managed Care, Other (non HMO) | Admitting: Family Medicine

## 2016-11-01 VITALS — BP 113/70 | HR 71 | Ht 64.57 in | Wt 141.0 lb

## 2016-11-01 DIAGNOSIS — Z01818 Encounter for other preprocedural examination: Secondary | ICD-10-CM | POA: Diagnosis not present

## 2016-11-01 DIAGNOSIS — Z23 Encounter for immunization: Secondary | ICD-10-CM | POA: Diagnosis not present
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
## 2016-11-03 ENCOUNTER — Encounter: Payer: Self-pay | Admitting: Family Medicine

## 2016-11-04 ENCOUNTER — Other Ambulatory Visit: Payer: Self-pay

## 2016-11-04 ENCOUNTER — Encounter: Payer: Self-pay | Admitting: Family Medicine

## 2016-11-04 LAB — NASAL CULTURE (N/P)

## 2016-11-04 MED ORDER — SCOPOLAMINE 1 MG/3DAYS TD PT72: 1.0000 | MEDICATED_PATCH | TRANSDERMAL | 0 refills | Status: DC

## 2016-11-04 MED ORDER — MUPIROCIN CALCIUM 2 % NA OINT: 1.0000 "application " | TOPICAL_OINTMENT | Freq: Two times a day (BID) | NASAL | 0 refills | Status: DC

## 2016-11-04 NOTE — Addendum Note (Signed)
Addended by: Donella Stade on: 11/04/2016 01:21 PM   Modules accepted: Orders

## 2016-11-04 NOTE — Progress Notes (Signed)
Call pt: abundant staph but not MRSA. Will treat with bactroban intranasal bid for 5 days. I went ahead and send to pharmacy.

## 2016-11-05 ENCOUNTER — Telehealth: Payer: Self-pay | Admitting: *Deleted

## 2016-11-05 NOTE — Telephone Encounter (Signed)
Outpatient surgery status clarification form completed, ov notes, ekg,labs faxed, confirmation received .[name]

## 2016-11-12 ENCOUNTER — Encounter: Payer: Self-pay | Admitting: Family Medicine

## 2016-11-21 ENCOUNTER — Other Ambulatory Visit: Payer: Self-pay

## 2016-11-21 MED ORDER — SCOPOLAMINE 1 MG/3DAYS TD PT72: 1.0000 | MEDICATED_PATCH | TRANSDERMAL | 0 refills | Status: DC

## 2017-01-03 ENCOUNTER — Encounter: Payer: Self-pay | Admitting: Family Medicine

## 2017-01-03 DIAGNOSIS — M2550 Pain in unspecified joint: Secondary | ICD-10-CM

## 2017-01-07 ENCOUNTER — Other Ambulatory Visit: Payer: Self-pay | Admitting: Orthopaedic Surgery

## 2017-01-07 DIAGNOSIS — M546 Pain in thoracic spine: Secondary | ICD-10-CM

## 2017-01-17 ENCOUNTER — Ambulatory Visit
Admission: RE | Admit: 2017-01-17 | Discharge: 2017-01-17 | Disposition: A | Discharge status: Home / Self Care | Payer: Managed Care, Other (non HMO) | Source: Ambulatory Visit | Attending: Orthopaedic Surgery | Admitting: Orthopaedic Surgery

## 2017-01-17 DIAGNOSIS — M546 Pain in thoracic spine: Secondary | ICD-10-CM

## 2017-02-05 ENCOUNTER — Encounter: Payer: Self-pay | Admitting: Family Medicine

## 2017-02-20 ENCOUNTER — Telehealth: Payer: Self-pay | Admitting: Family Medicine

## 2017-02-20 DIAGNOSIS — E2749 Other adrenocortical insufficiency: Secondary | ICD-10-CM

## 2017-02-20 NOTE — Telephone Encounter (Signed)
Spoke with patient and she is getting about 2.5 hours of sleep at night and it is only off and on. She feels physically stressed and can't shut off her brain. She would like to have the CBC, Thyroid level and Cortisol levels checked. She states she will reach out to her Cardiologist.

## 2017-02-20 NOTE — Telephone Encounter (Signed)
Labs ordered. She can go at her convenience. Preferably a morning blood draw.

## 2017-02-20 NOTE — Telephone Encounter (Signed)
  Please call patient in regards to my chart message below. IT came back in to my and basket as unread.     This message is to inform you that the patient has not yet read the following message. (Notification date: February 20, 2017)  RE: Non-Urgent Medical Question   From Hali Marry, [name] To [name] 02/06/2017 7:56 AM  Hi [name],  Since sure ACTH levels are back to normal have you seen her cardiologist recently? Particularly with the fatigue and dizziness. We could certainly check him to a cardiac issue. I know we just did some lab work about 4 months ago. We could always recheck her CBC and thyroid again. Do you feel like you are getting enough rest and stress levels are normal?   Dr. Jerilynn Mages   Previous Messages    ----- Message -----   From: [name]   Sent: 02/05/2017 3:25 PM EDT    To: Kristen Lecher, [name]  Subject: Non-Urgent Medical Question   Hi, so the endocrinology specialist at [facility] released me after the last blood test showed that my ACTH levels were back in normal range. But once again I feel like my motor is running fast, completely fatigued, but not sleeping, not hungry, stomach aches and dizziness still. Not sure which way to turn, would blood testing help show if there is something still happening?   I just don't feel right and I don't know what to do, any ideas ?   Thanks, [name] User Last Read On  [name] Not Read

## 2017-02-21 NOTE — Telephone Encounter (Signed)
Patient notified

## 2017-03-17 ENCOUNTER — Other Ambulatory Visit: Payer: Self-pay | Admitting: Orthopaedic Surgery

## 2017-03-17 DIAGNOSIS — M542 Cervicalgia: Secondary | ICD-10-CM

## 2017-03-26 ENCOUNTER — Ambulatory Visit
Admission: RE | Admit: 2017-03-26 | Discharge: 2017-03-26 | Disposition: A | Discharge status: Home / Self Care | Payer: Managed Care, Other (non HMO) | Source: Ambulatory Visit | Attending: Orthopaedic Surgery | Admitting: Orthopaedic Surgery

## 2017-03-26 DIAGNOSIS — M542 Cervicalgia: Secondary | ICD-10-CM

## 2017-10-18 HISTORY — PX: AUGMENTATION MAMMAPLASTY: SUR837

## 2018-05-04 ENCOUNTER — Other Ambulatory Visit: Payer: Self-pay | Admitting: Obstetrics and Gynecology

## 2018-05-04 DIAGNOSIS — Z1231 Encounter for screening mammogram for malignant neoplasm of breast: Secondary | ICD-10-CM

## 2018-07-22 ENCOUNTER — Encounter: Payer: Self-pay | Admitting: Family Medicine

## 2018-07-22 ENCOUNTER — Ambulatory Visit (INDEPENDENT_AMBULATORY_CARE_PROVIDER_SITE_OTHER): Payer: BLUE CROSS/BLUE SHIELD | Admitting: Family Medicine

## 2018-07-22 VITALS — BP 120/66 | HR 60 | Ht 65.0 in | Wt 142.0 lb

## 2018-07-22 DIAGNOSIS — E559 Vitamin D deficiency, unspecified: Secondary | ICD-10-CM | POA: Diagnosis not present

## 2018-07-22 DIAGNOSIS — E2740 Unspecified adrenocortical insufficiency: Secondary | ICD-10-CM

## 2018-07-22 DIAGNOSIS — R6889 Other general symptoms and signs: Secondary | ICD-10-CM | POA: Diagnosis not present

## 2018-07-22 DIAGNOSIS — G8929 Other chronic pain: Secondary | ICD-10-CM

## 2018-07-22 DIAGNOSIS — R5383 Other fatigue: Secondary | ICD-10-CM | POA: Diagnosis not present

## 2018-07-22 DIAGNOSIS — A6000 Herpesviral infection of urogenital system, unspecified: Secondary | ICD-10-CM

## 2018-07-22 DIAGNOSIS — B009 Herpesviral infection, unspecified: Secondary | ICD-10-CM

## 2018-07-22 DIAGNOSIS — R202 Paresthesia of skin: Secondary | ICD-10-CM

## 2018-07-22 DIAGNOSIS — R51 Headache: Secondary | ICD-10-CM

## 2018-07-22 DIAGNOSIS — F5101 Primary insomnia: Secondary | ICD-10-CM

## 2018-07-22 DIAGNOSIS — R519 Headache, unspecified: Secondary | ICD-10-CM

## 2018-07-22 DIAGNOSIS — D352 Benign neoplasm of pituitary gland: Secondary | ICD-10-CM

## 2018-07-22 NOTE — Progress Notes (Signed)
Established Patient Office Visit  Subjective:  Patient ID: [name], female    DOB: 1970-09-28  Age: 48 y.o. MRN: [id number]  CC:  Chief Complaint  Patient presents with  . Fatigue    HPI [name] presents for Fatigue.   Was followed at [facility] for adrenal insufficiency 2 years ago..  On her initial consult in April 2018 they were not sure if it was due to repetitive steroid injections or if she truly had isolated ACTH deficiency due to a pituitary microadenoma.  She was supposed to have an repeat MRI in 6 to 12 months.  Her labs eventually transition back to normal but she is starting to feel symptomatic again similar to when she was first diagnosed.  She reports that she just feels physically, emotionally stressed.  She feels like there is a pressure sensation all around her head.  It does not feel like a typical headache.  She has had more cold intolerance recently.  Feels like she is had some slight change in her vision.  No recent chest pain.  She has noticed that her feet are swollen more often.  She has a history of chronic constipation.  Has been sleeping poorly.  In fact she was seen by her surgeon who did neck surgery on her since I last saw her and he did give her a small quantity of Ambien to use as she had Artie tried several over-the-counter products without any help including melatonin and Benadryl.  She did feel like the Ambien worked well and does not did not cause her to feel excessively sedated.  She also reports feeling some occasional dizziness.  Also has a history of herpes simplex and says that more recently it continues to breakout repetitively especially over the last couple of months.  She says normally it only breaks out about twice a year.  She also had a 5.5 mm pituitary microadenoma seen on imaging back in 2017.  [facility] and recommended repeat MRI in 6 to 12 months.  Past Medical History:  Diagnosis Date  . Asthma    allergy, exercise induced  .  Atrial tachycardia ([place])   . Decreased cortisol level ([place])   . Lethargic   . Ovarian cyst   . PONV (postoperative nausea and vomiting)     Past Surgical History:  Procedure Laterality Date  . ablasion    . ANTERIOR AND POSTERIOR REPAIR N/A 04/29/2016   Procedure: ANTERIOR (CYSTOCELE) AND POSTERIOR REPAIR (RECTOCELE);  Surgeon: Janyth Contes, MD;  Location: [facility];  Service: Gynecology;  Laterality: N/A;  . ANTERIOR CRUCIATE LIGAMENT REPAIR Left   . BLADDER SUSPENSION  04/29/2016   Procedure: Attempted TRANSVAGINAL TAPE (TVT) PROCEDURE, Cystotomy;  Surgeon: Cheri Fowler, MD;  Location: [facility];  Service: Gynecology;;  . BREAST SURGERY     augmentation  . CARDIAC ELECTROPHYSIOLOGY STUDY AND ABLATION     twice  . CYSTOSCOPY N/A 04/29/2016   Procedure: CYSTOSCOPY;  Surgeon: Cheri Fowler, MD;  Location: [facility];  Service: Gynecology;  Laterality: N/A;  . LAPAROSCOPIC VAGINAL HYSTERECTOMY WITH SALPINGECTOMY Bilateral 04/29/2016   Procedure: LAPAROSCOPIC ASSISTED VAGINAL HYSTERECTOMY WITH Bilateral  SALPINGECTOMY;  Surgeon: Janyth Contes, MD;  Location: [facility];  Service: Gynecology;  Laterality: Bilateral;  . MYRINGOTOMY    . NASAL SEPTUM SURGERY    . TONSILLECTOMY AND ADENOIDECTOMY    . TUBAL LIGATION    . WISDOM TOOTH EXTRACTION      No family history on file.  Social History  Socioeconomic History  . Marital status: Divorced    Spouse name: Not on file  . Number of children: Not on file  . Years of education: Not on file  . Highest education level: Not on file  Occupational History  . Not on file  Social Needs  . Financial resource strain: Not on file  . Food insecurity:    Worry: Not on file    Inability: Not on file  . Transportation needs:    Medical: Not on file    Non-medical: Not on file  Tobacco Use  . Smoking status: Never Smoker  . Smokeless tobacco: Never Used  Substance and Sexual Activity  . Alcohol use: Yes  . Drug use: No  . Sexual  activity: Not on file  Lifestyle  . Physical activity:    Days per week: Not on file    Minutes per session: Not on file  . Stress: Not on file  Relationships  . Social connections:    Talks on phone: Not on file    Gets together: Not on file    Attends religious service: Not on file    Active member of club or organization: Not on file    Attends meetings of clubs or organizations: Not on file    Relationship status: Not on file  . Intimate partner violence:    Fear of current or ex partner: Not on file    Emotionally abused: Not on file    Physically abused: Not on file    Forced sexual activity: Not on file  Other Topics Concern  . Not on file  Social History Narrative  . Not on file    Outpatient Medications Prior to Visit  Medication Sig Dispense Refill  . furosemide (LASIX) 20 MG tablet TK 1 T PO D    . LINZESS 145 MCG CAPS capsule TK 1 C PO QAM    . zolpidem (AMBIEN) 5 MG tablet Take 5 mg by mouth at bedtime as needed for sleep.    . temazepam (RESTORIL) 22.5 MG capsule TK ONE C PO QHS    . albuterol (PROVENTIL HFA;VENTOLIN HFA) 108 (90 Base) MCG/ACT inhaler Inhale 2 puffs into the lungs every 6 (six) hours as needed for wheezing or shortness of breath. 1 Inhaler 2  . Fe Cbn-Fe Gluc-FA-B12-C-DSS (FERRALET 90) 90-1 MG TABS Take 1 tablet by mouth daily. 90 each 1  . fludrocortisone (FLORINEF) 0.1 MG tablet Take 1 tablet (0.1 mg total) by mouth 3 (three) times a week. 12 tablet 2  . ibuprofen (ADVIL,MOTRIN) 800 MG tablet Take 1 tablet (800 mg total) by mouth every 8 (eight) hours as needed (mild pain). 45 tablet 1  . mupirocin nasal ointment (BACTROBAN) 2 % Place 1 application into the nose 2 (two) times daily. Apply(1/2 tube) to both nares BID for 5 days. 10 g 0  . scopolamine (TRANSDERM-SCOP) 1 MG/3DAYS Place 1 patch (1.5 mg total) onto the skin every 3 (three) days. 4 patch 0   No facility-administered medications prior to visit.     Allergies  Allergen Reactions  .  Demerol Nausea And Vomiting and Swelling    ROS Review of Systems  Constitutional: Positive for chills and fatigue. Negative for unexpected weight change.  Respiratory: Negative for chest tightness and shortness of breath.   Cardiovascular: Negative for chest pain and palpitations.  Gastrointestinal: Positive for constipation. Negative for abdominal pain and vomiting.      Objective:    Physical Exam  Constitutional:  She is oriented to person, place, and time. She appears well-developed and well-nourished.  HENT:  Head: Normocephalic and atraumatic.  Right Ear: External ear normal.  Left Ear: External ear normal.  Nose: Nose normal.  Mouth/Throat: Oropharynx is clear and moist.  TMs and canals are clear.   Eyes: Pupils are equal, round, and reactive to light. Conjunctivae and EOM are normal. Right eye exhibits no discharge. Left eye exhibits no discharge.  Neck: Neck supple. No tracheal deviation present. No thyromegaly present.  Cardiovascular: Normal rate, regular rhythm and normal heart sounds.  Pulmonary/Chest: Effort normal and breath sounds normal. She has no wheezes.  Abdominal: Soft. Bowel sounds are normal. She exhibits no distension and no mass. There is no abdominal tenderness. There is no rebound and no guarding.  Musculoskeletal:        General: No edema.  Lymphadenopathy:    She has no cervical adenopathy.  Neurological: She is alert and oriented to person, place, and time.  Skin: Skin is warm and dry. No rash noted.  Psychiatric: She has a normal mood and affect. Her behavior is normal. Judgment and thought content normal.    BP 120/66   Pulse 60   Ht 5\' 5"  (1.651 m)   Wt 142 lb (64.4 kg)   SpO2 100%   BMI 23.63 kg/m  Wt Readings from Last 3 Encounters:  07/22/18 142 lb (64.4 kg)  11/01/16 141 lb (64 kg)  05/28/16 145 lb (65.8 kg)     Health Maintenance Due  Topic Date Due  . HIV Screening  01/31/1987  . MAMMOGRAM  06/28/2017    There are no  preventive care reminders to display for this patient.  Lab Results  Component Value Date   TSH 1.44 07/22/2018   Lab Results  Component Value Date   WBC 7.6 07/22/2018   HGB 14.1 07/22/2018   HCT 42.2 07/22/2018   MCV 89.4 07/22/2018   PLT 437 (H) 07/22/2018   Lab Results  Component Value Date   NA 138 07/22/2018   K 4.1 07/22/2018   CO2 28 07/22/2018   GLUCOSE 88 07/22/2018   BUN 9 07/22/2018   CREATININE 0.64 07/22/2018   BILITOT 0.4 07/22/2018   ALKPHOS 52 04/17/2016   AST 18 07/22/2018   ALT 8 07/22/2018   PROT 7.1 07/22/2018   ALBUMIN 3.6 04/17/2016   CALCIUM 9.4 07/22/2018   ANIONGAP 8 04/30/2016   No results found for: CHOL No results found for: HDL No results found for: LDLCALC No results found for: TRIG No results found for: CHOLHDL Lab Results  Component Value Date   HGBA1C 5.1 07/22/2018      Assessment & Plan:   Problem List Items Addressed This Visit      Endocrine   Pituitary microadenoma [facility])    Due for repeat MRI.  Schedule.      Relevant Orders   MR Brain Wo Contrast     Genitourinary   Genital herpes    Put her on suppressive therapy with valacyclovir 1 g/day.      Relevant Medications   valACYclovir (VALTREX) 1000 MG tablet     Other   HSV (herpes simplex virus) infection   Relevant Medications   valACYclovir (VALTREX) 1000 MG tablet    Other Visit Diagnoses    Fatigue, unspecified type    -  Primary   Relevant Orders   CBC (Completed)   COMPLETE METABOLIC PANEL WITH GFR (Completed)   TSH (Completed)   VITAMIN  D 25 Hydroxy (Vit-D Deficiency, Fractures) (Completed)   Hemoglobin A1c (Completed)   Estradiol (Completed)   Follicle stimulating hormone (Completed)   Luteinizing hormone (Completed)   Progesterone (Completed)   Cortisol   ACTH   Fe+TIBC+Fer (Completed)   B12 and Folate Panel (Completed)   Magnesium (Completed)   Sedimentation rate (Completed)   Vitamin B6   Vitamin B1   Ambulatory referral to  Endocrinology   Cold intolerance       Relevant Orders   CBC (Completed)   COMPLETE METABOLIC PANEL WITH GFR (Completed)   TSH (Completed)   VITAMIN D 25 Hydroxy (Vit-D Deficiency, Fractures) (Completed)   Hemoglobin A1c (Completed)   Estradiol (Completed)   Follicle stimulating hormone (Completed)   Luteinizing hormone (Completed)   Progesterone (Completed)   Cortisol   ACTH   Fe+TIBC+Fer (Completed)   B12 and Folate Panel (Completed)   Magnesium (Completed)   Sedimentation rate (Completed)   Vitamin B6   Vitamin B1   Ambulatory referral to Endocrinology   Vitamin D deficiency       Relevant Orders   CBC (Completed)   COMPLETE METABOLIC PANEL WITH GFR (Completed)   TSH (Completed)   VITAMIN D 25 Hydroxy (Vit-D Deficiency, Fractures) (Completed)   Hemoglobin A1c (Completed)   Estradiol (Completed)   Follicle stimulating hormone (Completed)   Luteinizing hormone (Completed)   Progesterone (Completed)   Cortisol   ACTH   Fe+TIBC+Fer (Completed)   B12 and Folate Panel (Completed)   Magnesium (Completed)   Sedimentation rate (Completed)   Vitamin B6   Vitamin B1   Ambulatory referral to Endocrinology   Adrenal insufficiency (HCC)       Relevant Orders   CBC (Completed)   COMPLETE METABOLIC PANEL WITH GFR (Completed)   TSH (Completed)   VITAMIN D 25 Hydroxy (Vit-D Deficiency, Fractures) (Completed)   Hemoglobin A1c (Completed)   Estradiol (Completed)   Follicle stimulating hormone (Completed)   Luteinizing hormone (Completed)   Progesterone (Completed)   Cortisol   ACTH   Fe+TIBC+Fer (Completed)   B12 and Folate Panel (Completed)   Magnesium (Completed)   Sedimentation rate (Completed)   Vitamin B6   Vitamin B1   Ambulatory referral to Endocrinology   Paresthesias       Relevant Orders   CBC (Completed)   COMPLETE METABOLIC PANEL WITH GFR (Completed)   TSH (Completed)   VITAMIN D 25 Hydroxy (Vit-D Deficiency, Fractures) (Completed)   Hemoglobin A1c  (Completed)   Estradiol (Completed)   Follicle stimulating hormone (Completed)   Luteinizing hormone (Completed)   Progesterone (Completed)   Cortisol   ACTH   Fe+TIBC+Fer (Completed)   B12 and Folate Panel (Completed)   Magnesium (Completed)   Sedimentation rate (Completed)   Vitamin B6   Vitamin B1   Ambulatory referral to Endocrinology   Chronic nonintractable headache, unspecified headache type       Relevant Orders   MR Brain Wo Contrast   Primary insomnia          For fatigue and cold intolerance-we will check thyroid levels and evaluate for anemia.  With her history of adrenal insufficiency-we will check cortisol and ACTH levels.  Paresthesias-again we will check for anemia and thyroid disorder.  Meds ordered this encounter  Medications  . valACYclovir (VALTREX) 1000 MG tablet    Sig: Take 1 tablet (1,000 mg total) by mouth daily.    Dispense:  30 tablet    Refill:  3    Follow-up: Return if symptoms  worsen or fail to improve.    [name], MD

## 2018-07-23 ENCOUNTER — Encounter: Payer: Self-pay | Admitting: Family Medicine

## 2018-07-23 DIAGNOSIS — D352 Benign neoplasm of pituitary gland: Secondary | ICD-10-CM | POA: Insufficient documentation

## 2018-07-23 DIAGNOSIS — B009 Herpesviral infection, unspecified: Secondary | ICD-10-CM | POA: Insufficient documentation

## 2018-07-23 MED ORDER — VALACYCLOVIR HCL 1 G PO TABS: 1000.0000 mg | ORAL_TABLET | Freq: Every day | ORAL | 3 refills | Status: DC

## 2018-07-23 NOTE — Assessment & Plan Note (Signed)
Put her on suppressive therapy with valacyclovir 1 g/day.

## 2018-07-23 NOTE — Assessment & Plan Note (Signed)
Due for repeat MRI.  Schedule.

## 2018-07-24 ENCOUNTER — Encounter: Payer: Self-pay | Admitting: Family Medicine

## 2018-07-26 LAB — CBC
HCT: 42.2 % (ref 35.0–45.0)
Hemoglobin: 14.1 g/dL (ref 11.7–15.5)
MCH: 29.9 pg (ref 27.0–33.0)
MCHC: 33.4 g/dL (ref 32.0–36.0)
MCV: 89.4 fL (ref 80.0–100.0)
MPV: 10.9 fL (ref 7.5–12.5)
Platelets: 437 10*3/uL — ABNORMAL HIGH (ref 140–400)
RBC: 4.72 10*6/uL (ref 3.80–5.10)
RDW: 12.1 % (ref 11.0–15.0)
WBC: 7.6 10*3/uL (ref 3.8–10.8)

## 2018-07-26 LAB — COMPLETE METABOLIC PANEL WITH GFR
AG Ratio: 1.5 (calc) (ref 1.0–2.5)
ALT: 8 U/L (ref 6–29)
AST: 18 U/L (ref 10–35)
Albumin: 4.3 g/dL (ref 3.6–5.1)
Alkaline phosphatase (APISO): 57 U/L (ref 31–125)
BUN: 9 mg/dL (ref 7–25)
CO2: 28 mmol/L (ref 20–32)
Calcium: 9.4 mg/dL (ref 8.6–10.2)
Chloride: 102 mmol/L (ref 98–110)
Creat: 0.64 mg/dL (ref 0.50–1.10)
GFR, Est African American: 124 mL/min/{1.73_m2} (ref >=60)
GFR, Est Non African American: 107 mL/min/{1.73_m2} (ref >=60)
Globulin: 2.8 g/dL (calc) (ref 1.9–3.7)
Glucose, Bld: 88 mg/dL (ref 65–99)
Potassium: 4.1 mmol/L (ref 3.5–5.3)
Sodium: 138 mmol/L (ref 135–146)
Total Bilirubin: 0.4 mg/dL (ref 0.2–1.2)
Total Protein: 7.1 g/dL (ref 6.1–8.1)

## 2018-07-26 LAB — HEMOGLOBIN A1C
Hgb A1c MFr Bld: 5.1 % of total Hgb (ref <5.7)
Mean Plasma Glucose: 100 (calc)
eAG (mmol/L): 5.5 (calc)

## 2018-07-26 LAB — LUTEINIZING HORMONE: LH: 35.4 m[IU]/mL

## 2018-07-26 LAB — CORTISOL: Cortisol, Plasma: 6.1 ug/dL

## 2018-07-26 LAB — ACTH: C206 ACTH: 8 pg/mL (ref 6–50)

## 2018-07-26 LAB — B12 AND FOLATE PANEL
Folate: 17.1 ng/mL
Vitamin B-12: 1006 pg/mL (ref 200–1100)

## 2018-07-26 LAB — PROGESTERONE: Progesterone: 0.5 ng/mL

## 2018-07-26 LAB — SEDIMENTATION RATE: Sed Rate: 6 mm/h (ref 0–20)

## 2018-07-26 LAB — VITAMIN B1: Vitamin B1 (Thiamine): 17 nmol/L (ref 8–30)

## 2018-07-26 LAB — IRON,TIBC AND FERRITIN PANEL
%SAT: 39 % (calc) (ref 16–45)
Ferritin: 42 ng/mL (ref 16–232)
Iron: 119 ug/dL (ref 40–190)
TIBC: 308 mcg/dL (calc) (ref 250–450)

## 2018-07-26 LAB — FOLLICLE STIMULATING HORMONE: FSH: 17.6 m[IU]/mL

## 2018-07-26 LAB — VITAMIN B6: Vitamin B6: 7.7 ng/mL (ref 2.1–21.7)

## 2018-07-26 LAB — TSH: TSH: 1.44 mIU/L

## 2018-07-26 LAB — VITAMIN D 25 HYDROXY (VIT D DEFICIENCY, FRACTURES): Vit D, 25-Hydroxy: 36 ng/mL (ref 30–100)

## 2018-07-26 LAB — MAGNESIUM: Magnesium: 2.1 mg/dL (ref 1.5–2.5)

## 2018-07-26 LAB — ESTRADIOL: Estradiol: 101 pg/mL

## 2018-08-09 ENCOUNTER — Encounter: Payer: Self-pay | Admitting: Family Medicine

## 2018-08-09 ENCOUNTER — Telehealth: Payer: Self-pay | Admitting: Family Medicine

## 2018-08-09 NOTE — Telephone Encounter (Signed)
MRI was cancelled. Do you want to reorder this as an ASAP exam? Routing.

## 2018-08-09 NOTE — Telephone Encounter (Signed)
Yes ok to re-order at urgent.

## 2018-08-10 ENCOUNTER — Other Ambulatory Visit: Payer: BLUE CROSS/BLUE SHIELD

## 2018-08-10 ENCOUNTER — Other Ambulatory Visit: Payer: Self-pay

## 2018-08-10 ENCOUNTER — Ambulatory Visit (INDEPENDENT_AMBULATORY_CARE_PROVIDER_SITE_OTHER): Payer: BLUE CROSS/BLUE SHIELD

## 2018-08-10 DIAGNOSIS — R519 Headache, unspecified: Secondary | ICD-10-CM

## 2018-08-10 DIAGNOSIS — D352 Benign neoplasm of pituitary gland: Secondary | ICD-10-CM

## 2018-08-10 DIAGNOSIS — R51 Headache: Secondary | ICD-10-CM | POA: Diagnosis not present

## 2018-08-10 DIAGNOSIS — G8929 Other chronic pain: Secondary | ICD-10-CM

## 2018-08-10 MED ORDER — GADOBENATE DIMEGLUMINE 529 MG/ML IV SOLN
10.0000 mL | Freq: Once | INTRAVENOUS | Status: AC | PRN
  Administered 2018-08-10: 9 mL via INTRAVENOUS

## 2018-08-10 MED ORDER — AMOXICILLIN-POT CLAVULANATE 875-125 MG PO TABS: 1.0000 | ORAL_TABLET | Freq: Two times a day (BID) | ORAL | 0 refills | Status: DC

## 2018-08-10 NOTE — Progress Notes (Unsigned)
rx sent for sinusitis based on MRI>

## 2018-08-11 NOTE — Telephone Encounter (Signed)
Order was changed and scan completed.

## 2018-08-14 NOTE — Telephone Encounter (Signed)
Patient scheduled.

## 2018-08-15 ENCOUNTER — Encounter: Payer: Self-pay | Admitting: Family Medicine

## 2018-08-17 ENCOUNTER — Other Ambulatory Visit: Payer: Self-pay

## 2018-08-17 ENCOUNTER — Ambulatory Visit (INDEPENDENT_AMBULATORY_CARE_PROVIDER_SITE_OTHER): Payer: BLUE CROSS/BLUE SHIELD | Admitting: Family Medicine

## 2018-08-17 VITALS — Temp 97.7°F

## 2018-08-17 DIAGNOSIS — Z23 Encounter for immunization: Secondary | ICD-10-CM | POA: Diagnosis not present

## 2018-08-17 MED ORDER — CEFDINIR 300 MG PO CAPS: 300.0000 mg | ORAL_CAPSULE | Freq: Two times a day (BID) | ORAL | 0 refills | Status: DC

## 2018-08-17 NOTE — Progress Notes (Signed)
Agree with documentation as above.   Yanin Muhlestein, MD  

## 2018-08-17 NOTE — Progress Notes (Signed)
Pt came by clinic today (drive by) to get pneumovax 23. This was recommended by PCP. Pt tolerated injection in right deltoid well, no immediate complications.

## 2018-08-29 ENCOUNTER — Encounter: Payer: Self-pay | Admitting: Family Medicine

## 2018-08-31 ENCOUNTER — Encounter: Payer: Self-pay | Admitting: Family Medicine

## 2018-08-31 ENCOUNTER — Telehealth (INDEPENDENT_AMBULATORY_CARE_PROVIDER_SITE_OTHER): Payer: BLUE CROSS/BLUE SHIELD | Admitting: Family Medicine

## 2018-08-31 VITALS — Temp 97.2°F | Wt 135.0 lb

## 2018-08-31 DIAGNOSIS — F5101 Primary insomnia: Secondary | ICD-10-CM | POA: Diagnosis not present

## 2018-08-31 DIAGNOSIS — J329 Chronic sinusitis, unspecified: Secondary | ICD-10-CM | POA: Diagnosis not present

## 2018-08-31 MED ORDER — ZOLPIDEM TARTRATE 10 MG PO TABS: 10.0000 mg | ORAL_TABLET | Freq: Every evening | ORAL | 0 refills | Status: DC | PRN

## 2018-08-31 MED ORDER — FLUCONAZOLE 150 MG PO TABS: 150.0000 mg | ORAL_TABLET | Freq: Once | ORAL | 1 refills | Status: AC

## 2018-08-31 MED ORDER — CEFDINIR 300 MG PO CAPS: 300.0000 mg | ORAL_CAPSULE | Freq: Two times a day (BID) | ORAL | 0 refills | Status: DC

## 2018-08-31 NOTE — Addendum Note (Signed)
Addended by: [name] D on: 08/31/2018 02:02 PM   Modules accepted: Level of Service

## 2018-08-31 NOTE — Progress Notes (Signed)
Virtual Visit via Video Note  I connected with [name] on 08/31/18 at  1:20 PM EDT by a video enabled telemedicine application and verified that I am speaking with the correct person using two identifiers.   I discussed the limitations of evaluation and management by telemedicine and the availability of in person appointments. The patient expressed understanding and agreed to proceed.  Subjective:    CC: sleep  HPI:  48 year old female is really struggling with sleep.  She has used temazepam for years and says more recently it just does not seem to be as effective plus she is waking up at 2 AM wide-awake and not able to get back asleep.  At one point her neurosurgeon had given her short supply of zolpidem in the past and she says she remembers that working well and getting at least 6 hours of sleep on it.  She had actually tried to call their office for refill but because it had been a while they had recommended that she talk to her PCP about it. She has chronic insomia.  She tried a whole 10mg  and that worked much better.   she feels like she has some pressure and congestions.  Feels like there is congestion that doesn't seem to work. She feels and 2nd round of abx really did help but started to feel bad again after a few days off the antibiotics.  Tried even a decongestant and didn't really help. She is starting to get the bad headaches again.    Past medical history, Surgical history, Family history not pertinant except as noted below, Social history, Allergies, and medications have been entered into the medical record, reviewed, and corrections made.   Review of Systems: No fevers, chills, night sweats, weight loss, chest pain, or shortness of breath.   Objective:    General: Speaking clearly in complete sentences without any shortness of breath.  Alert and oriented x3.  Normal judgment. No apparent acute distress. Well groomed.      Impression and Recommendations:     Insomnia, Chronic - discussed options. Will send rx for Ambien 10mg . Warned that 5mg  if the FDA approved dose for women.  She says it really is what was most effective.  Monitor for any side effects.  Chronic sinus infection - will try another round of omnicef but will do 10 days. All nasal saline irrigation. Has done it years ago.  Add flonase. Stop decongestants. Call if not better and can refer to [facility] if needed.     I discussed the assessment and treatment plan with the patient. The patient was provided an opportunity to ask questions and all were answered. The patient agreed with the plan and demonstrated an understanding of the instructions.   The patient was advised to call back or seek an in-person evaluation if the symptoms worsen or if the condition fails to improve as anticipated.   [name], MD

## 2018-08-31 NOTE — Progress Notes (Signed)
Pt reports that when she tried taking the Rstoril this only a few hours. And she read that 10 mg of the Ambein (although this is for a man) she believes this helps her to sleep. Maryruth Eve, Lahoma Crocker, CMA

## 2018-10-13 ENCOUNTER — Other Ambulatory Visit: Payer: BLUE CROSS/BLUE SHIELD

## 2018-10-13 ENCOUNTER — Encounter: Payer: Self-pay | Admitting: Family Medicine

## 2018-10-13 ENCOUNTER — Ambulatory Visit (INDEPENDENT_AMBULATORY_CARE_PROVIDER_SITE_OTHER): Payer: BLUE CROSS/BLUE SHIELD | Admitting: Family Medicine

## 2018-10-13 ENCOUNTER — Telehealth: Payer: Self-pay | Admitting: Hematology

## 2018-10-13 VITALS — Temp 99.3°F

## 2018-10-13 DIAGNOSIS — Z20822 Contact with and (suspected) exposure to covid-19: Secondary | ICD-10-CM

## 2018-10-13 DIAGNOSIS — R6889 Other general symptoms and signs: Secondary | ICD-10-CM | POA: Diagnosis not present

## 2018-10-13 NOTE — Telephone Encounter (Signed)
Did an E- visit with her today.  She is definitely a COVID suspect based on her symptoms.  Because of her PSVT, atrial septal defect and allergic asthma I do recommend that she get tested as I do feel like she is at higher risk for developing complications.  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  Please schedule for testing at the testing center at the [facility].

## 2018-10-13 NOTE — Progress Notes (Signed)
Cough 3 nights ago. At night she feels like she cant breath she has to sit up to sleep.  She coughs throughout the day, dry cough this has gotten worse within the past 24 hours along with runny nose. C/o fatigue and body aches She has been taking advil, and dyquil.  Headache on/off not constant. She has had low grade fevers nothing over 99.3 denies n/v/c/s/d.    She has a granddaughter was sick last week w/elevated fever that she watches a couple of days a week.  [name], Lahoma Crocker, CMA

## 2018-10-13 NOTE — Telephone Encounter (Signed)
Test ordered and patient scheduled for today at 2:30.

## 2018-10-13 NOTE — Progress Notes (Signed)
Virtual Visit via Video Note  I connected with [name] on 10/13/18 at  1:20 PM EDT by a video enabled telemedicine application and verified that I am speaking with the correct person using two identifiers.   I discussed the limitations of evaluation and management by telemedicine and the availability of in person appointments. The patient expressed understanding and agreed to proceed.  Pt was at home and I was in my office for the virtual visit.     Subjective:    CC: Cough   HPI:  Cough started 3 nights ago. At night she feels like she can't breath she has to sit up to sleep. She coughs throughout the day, dry cough this has gotten worse within the past 24 hours along with runny nose. C/o fatigue and body aches She has been taking advil, and dyquil. Feels like her eyes are burning.  Feels like her chest is burning at times. No ST. Mild HA and eas hurting.  She was around her granddaughter last week who ran a high fever but otherwise had no symptoms.  Headache on/off not constant. She has had low grade fevers nothing over 99.3 denies n/v/c/s/d. No bodyaches.  Has felt feverish butr when checks it is is low grade. She feels like she is getting worse.   She has a granddaughter was sick last week w/elevated fever that she watches a couple of days a week.  She is her father's only caregiver and takes care of her 33 month old grandchild.   Past medical history, Surgical history, Family history not pertinant except as noted below, Social history, Allergies, and medications have been entered into the medical record, reviewed, and corrections made.   Review of Systems: No fevers, chills, night sweats, weight loss, chest pain, or shortness of breath.   Objective:    General: Speaking clearly in complete sentences without any shortness of breath.  Alert and oriented x3.  Normal judgment. No apparent acute distress.    Impression and Recommendations:    COVID suspect -been sick  now for 3 days but feels like she is gradually getting worse.  Discussed that it is likely a viral syndrome.  Possibly COVID.  With dry cough burning in her chest and low-grade fever recommend test for COVID.  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  Plus she babysits her 67-month-old granddaughter.  Recommend symptomatic care with Tylenol Motrin, hydration and rest.  She is to get emergency department immediately if she feels like she is getting short of breath or having more chest discomfort.  Her history of PSVT, ASD and allergic asthma I do think she is high enough risk that she should be tested.     I discussed the assessment and treatment plan with the patient. The patient was provided an opportunity to ask questions and all were answered. The patient agreed with the plan and demonstrated an understanding of the instructions.   The patient was advised to call back or seek an in-person evaluation if the symptoms worsen or if the condition fails to improve as anticipated.   [name], MD

## 2018-10-13 NOTE — Telephone Encounter (Signed)
Pt scheduled for OV today.  

## 2018-10-15 LAB — NOVEL CORONAVIRUS, NAA: SARS-CoV-2, NAA: NOT DETECTED

## 2019-01-27 DIAGNOSIS — M5416 Radiculopathy, lumbar region: Secondary | ICD-10-CM | POA: Diagnosis not present

## 2019-01-27 DIAGNOSIS — M545 Low back pain: Secondary | ICD-10-CM | POA: Diagnosis not present

## 2019-02-04 DIAGNOSIS — M25462 Effusion, left knee: Secondary | ICD-10-CM | POA: Diagnosis not present

## 2019-02-04 DIAGNOSIS — Z4789 Encounter for other orthopedic aftercare: Secondary | ICD-10-CM | POA: Diagnosis not present

## 2019-02-04 DIAGNOSIS — M25562 Pain in left knee: Secondary | ICD-10-CM | POA: Diagnosis not present

## 2019-02-23 DIAGNOSIS — F4323 Adjustment disorder with mixed anxiety and depressed mood: Secondary | ICD-10-CM | POA: Diagnosis not present

## 2019-03-01 DIAGNOSIS — M5416 Radiculopathy, lumbar region: Secondary | ICD-10-CM | POA: Diagnosis not present

## 2019-03-01 DIAGNOSIS — M5116 Intervertebral disc disorders with radiculopathy, lumbar region: Secondary | ICD-10-CM | POA: Diagnosis not present

## 2019-03-01 DIAGNOSIS — M4807 Spinal stenosis, lumbosacral region: Secondary | ICD-10-CM | POA: Diagnosis not present

## 2019-03-01 DIAGNOSIS — M48061 Spinal stenosis, lumbar region without neurogenic claudication: Secondary | ICD-10-CM | POA: Diagnosis not present

## 2019-03-01 DIAGNOSIS — M5117 Intervertebral disc disorders with radiculopathy, lumbosacral region: Secondary | ICD-10-CM | POA: Diagnosis not present

## 2019-03-09 ENCOUNTER — Encounter: Payer: Self-pay | Admitting: Family Medicine

## 2019-03-10 MED ORDER — TEMAZEPAM 22.5 MG PO CAPS: 22.5000 mg | ORAL_CAPSULE | Freq: Every evening | ORAL | 3 refills | Status: DC | PRN

## 2019-03-10 NOTE — Telephone Encounter (Signed)
Med sent. pls ask about flu anf pap

## 2019-03-16 DIAGNOSIS — F4323 Adjustment disorder with mixed anxiety and depressed mood: Secondary | ICD-10-CM | POA: Diagnosis not present

## 2019-03-17 DIAGNOSIS — Z01812 Encounter for preprocedural laboratory examination: Secondary | ICD-10-CM | POA: Diagnosis not present

## 2019-03-17 DIAGNOSIS — S83512A Sprain of anterior cruciate ligament of left knee, initial encounter: Secondary | ICD-10-CM | POA: Diagnosis not present

## 2019-03-17 DIAGNOSIS — X58XXXA Exposure to other specified factors, initial encounter: Secondary | ICD-10-CM | POA: Diagnosis not present

## 2019-03-17 DIAGNOSIS — Z20828 Contact with and (suspected) exposure to other viral communicable diseases: Secondary | ICD-10-CM | POA: Diagnosis not present

## 2019-03-19 ENCOUNTER — Encounter: Payer: Self-pay | Admitting: Family Medicine

## 2019-03-22 DIAGNOSIS — G8918 Other acute postprocedural pain: Secondary | ICD-10-CM | POA: Diagnosis not present

## 2019-03-22 DIAGNOSIS — X58XXXA Exposure to other specified factors, initial encounter: Secondary | ICD-10-CM | POA: Diagnosis not present

## 2019-03-22 DIAGNOSIS — S83512A Sprain of anterior cruciate ligament of left knee, initial encounter: Secondary | ICD-10-CM | POA: Diagnosis not present

## 2019-03-23 DIAGNOSIS — Z9889 Other specified postprocedural states: Secondary | ICD-10-CM | POA: Diagnosis not present

## 2019-03-23 DIAGNOSIS — M25562 Pain in left knee: Secondary | ICD-10-CM | POA: Diagnosis not present

## 2019-03-30 ENCOUNTER — Other Ambulatory Visit: Payer: Self-pay | Admitting: Family Medicine

## 2019-03-30 DIAGNOSIS — Z7409 Other reduced mobility: Secondary | ICD-10-CM | POA: Diagnosis not present

## 2019-03-30 DIAGNOSIS — S83512D Sprain of anterior cruciate ligament of left knee, subsequent encounter: Secondary | ICD-10-CM | POA: Diagnosis not present

## 2019-03-30 DIAGNOSIS — M25562 Pain in left knee: Secondary | ICD-10-CM | POA: Diagnosis not present

## 2019-03-30 DIAGNOSIS — R29898 Other symptoms and signs involving the musculoskeletal system: Secondary | ICD-10-CM | POA: Diagnosis not present

## 2019-04-01 DIAGNOSIS — R29898 Other symptoms and signs involving the musculoskeletal system: Secondary | ICD-10-CM | POA: Diagnosis not present

## 2019-04-01 DIAGNOSIS — Z7409 Other reduced mobility: Secondary | ICD-10-CM | POA: Diagnosis not present

## 2019-04-01 DIAGNOSIS — M25562 Pain in left knee: Secondary | ICD-10-CM | POA: Diagnosis not present

## 2019-04-01 DIAGNOSIS — S83512D Sprain of anterior cruciate ligament of left knee, subsequent encounter: Secondary | ICD-10-CM | POA: Diagnosis not present

## 2019-04-05 DIAGNOSIS — R29898 Other symptoms and signs involving the musculoskeletal system: Secondary | ICD-10-CM | POA: Diagnosis not present

## 2019-04-05 DIAGNOSIS — M25562 Pain in left knee: Secondary | ICD-10-CM | POA: Diagnosis not present

## 2019-04-05 DIAGNOSIS — S83512D Sprain of anterior cruciate ligament of left knee, subsequent encounter: Secondary | ICD-10-CM | POA: Diagnosis not present

## 2019-04-05 DIAGNOSIS — Z7409 Other reduced mobility: Secondary | ICD-10-CM | POA: Diagnosis not present

## 2019-04-08 DIAGNOSIS — F4323 Adjustment disorder with mixed anxiety and depressed mood: Secondary | ICD-10-CM | POA: Diagnosis not present

## 2019-04-12 DIAGNOSIS — M25662 Stiffness of left knee, not elsewhere classified: Secondary | ICD-10-CM | POA: Diagnosis not present

## 2019-04-12 DIAGNOSIS — Z7409 Other reduced mobility: Secondary | ICD-10-CM | POA: Diagnosis not present

## 2019-04-12 DIAGNOSIS — S83512D Sprain of anterior cruciate ligament of left knee, subsequent encounter: Secondary | ICD-10-CM | POA: Diagnosis not present

## 2019-04-12 DIAGNOSIS — R29898 Other symptoms and signs involving the musculoskeletal system: Secondary | ICD-10-CM | POA: Diagnosis not present

## 2019-04-19 DIAGNOSIS — Z7409 Other reduced mobility: Secondary | ICD-10-CM | POA: Diagnosis not present

## 2019-04-19 DIAGNOSIS — R29898 Other symptoms and signs involving the musculoskeletal system: Secondary | ICD-10-CM | POA: Diagnosis not present

## 2019-04-19 DIAGNOSIS — S83512D Sprain of anterior cruciate ligament of left knee, subsequent encounter: Secondary | ICD-10-CM | POA: Diagnosis not present

## 2019-04-19 DIAGNOSIS — M25562 Pain in left knee: Secondary | ICD-10-CM | POA: Diagnosis not present

## 2019-04-21 ENCOUNTER — Encounter: Payer: Self-pay | Admitting: Family Medicine

## 2019-04-27 ENCOUNTER — Encounter: Payer: Self-pay | Admitting: Family Medicine

## 2019-04-27 ENCOUNTER — Ambulatory Visit: Payer: BLUE CROSS/BLUE SHIELD | Admitting: Family Medicine

## 2019-04-27 ENCOUNTER — Ambulatory Visit (INDEPENDENT_AMBULATORY_CARE_PROVIDER_SITE_OTHER): Payer: BLUE CROSS/BLUE SHIELD | Admitting: Family Medicine

## 2019-04-27 ENCOUNTER — Other Ambulatory Visit: Payer: Self-pay

## 2019-04-27 VITALS — Temp 99.9°F

## 2019-04-27 DIAGNOSIS — R509 Fever, unspecified: Secondary | ICD-10-CM

## 2019-04-27 DIAGNOSIS — R21 Rash and other nonspecific skin eruption: Secondary | ICD-10-CM

## 2019-04-27 DIAGNOSIS — R0602 Shortness of breath: Secondary | ICD-10-CM | POA: Diagnosis not present

## 2019-04-27 DIAGNOSIS — Z209 Contact with and (suspected) exposure to unspecified communicable disease: Secondary | ICD-10-CM

## 2019-04-27 DIAGNOSIS — Z20822 Contact with and (suspected) exposure to covid-19: Secondary | ICD-10-CM

## 2019-04-27 DIAGNOSIS — Z20828 Contact with and (suspected) exposure to other viral communicable diseases: Secondary | ICD-10-CM

## 2019-04-27 NOTE — Progress Notes (Signed)
Patient is coming by the office for testing of Covid-19.

## 2019-04-27 NOTE — Telephone Encounter (Signed)
Called and scheduled patient for virtual appt with PCP to discuss SX.  Will call testing site to ask about testing.Marland Kitchen

## 2019-04-27 NOTE — Progress Notes (Signed)
Virtual Visit via Video Note  I connected with [name] on 04/27/19 at  1:00 PM EST by a video enabled telemedicine application and verified that I am speaking with the correct person using two identifiers.   I discussed the limitations of evaluation and management by telemedicine and the availability of in person appointments. The patient expressed understanding and agreed to proceed.  Subjective:    CC: Fever, Rash  HPI:  48 yo female c/o of not feeling well for about 8 days.  She says a couple weeks ago she went out to a bar she was not wearing a facemask and that a couple days later found out that it has been close down because when the bartender is actually tested positive for Covid.  She says about a week later she started developing symptoms including bilateral ear pain headaches feeling really fatigued, short of breath and with a mild cough.  She also started breaking out with a rash on her face which she describes as pimples.  It started mostly around the mouth area and then has spread over her face, behind her ears, and down to her neck.  She is run a low-grade temperature between 99.9 and 100.  Has been using Advil and DayQuil.  Temperature 99.9-100.  Past medical history, Surgical history, Family history not pertinant except as noted below, Social history, Allergies, and medications have been entered into the medical record, reviewed, and corrections made.   Review of Systems: No fevers, chills, night sweats, weight loss, chest pain, or shortness of breath.   Objective:    General: Speaking clearly in complete sentences without any shortness of breath.  Alert and oriented x3.  Normal judgment. No apparent acute distress.    Impression and Recommendations:   Fever/rash-possible suspect Covid.  Recommend that she get swabbed.  She actually already tried to go to our center in [place] but this time said that she had to have an appointment so she left and came back  home.  You can have her come back this afternoon.  There are some people presenting with rash so this certainly could be further presentation.  If negative and not improving then consider bronchitis.    I discussed the assessment and treatment plan with the patient. The patient was provided an opportunity to ask questions and all were answered. The patient agreed with the plan and demonstrated an understanding of the instructions.   The patient was advised to call back or seek an in-person evaluation if the symptoms worsen or if the condition fails to improve as anticipated.   [name], MD

## 2019-04-29 ENCOUNTER — Encounter: Payer: Self-pay | Admitting: Family Medicine

## 2019-04-29 LAB — NOVEL CORONAVIRUS, NAA: SARS-CoV-2, NAA: NOT DETECTED

## 2019-04-29 MED ORDER — AMOXICILLIN-POT CLAVULANATE 875-125 MG PO TABS: 1.0000 | ORAL_TABLET | Freq: Two times a day (BID) | ORAL | 0 refills | Status: DC

## 2019-04-30 DIAGNOSIS — F4323 Adjustment disorder with mixed anxiety and depressed mood: Secondary | ICD-10-CM | POA: Diagnosis not present

## 2019-05-04 DIAGNOSIS — M25662 Stiffness of left knee, not elsewhere classified: Secondary | ICD-10-CM | POA: Diagnosis not present

## 2019-05-04 DIAGNOSIS — R29898 Other symptoms and signs involving the musculoskeletal system: Secondary | ICD-10-CM | POA: Diagnosis not present

## 2019-05-04 DIAGNOSIS — Z7409 Other reduced mobility: Secondary | ICD-10-CM | POA: Diagnosis not present

## 2019-05-04 DIAGNOSIS — S83512D Sprain of anterior cruciate ligament of left knee, subsequent encounter: Secondary | ICD-10-CM | POA: Diagnosis not present

## 2019-05-06 DIAGNOSIS — R5383 Other fatigue: Secondary | ICD-10-CM | POA: Diagnosis not present

## 2019-05-06 DIAGNOSIS — Z8639 Personal history of other endocrine, nutritional and metabolic disease: Secondary | ICD-10-CM | POA: Diagnosis not present

## 2019-05-06 DIAGNOSIS — E271 Primary adrenocortical insufficiency: Secondary | ICD-10-CM | POA: Diagnosis not present

## 2019-05-11 ENCOUNTER — Encounter: Payer: Self-pay | Admitting: Family Medicine

## 2019-05-11 DIAGNOSIS — S83512D Sprain of anterior cruciate ligament of left knee, subsequent encounter: Secondary | ICD-10-CM | POA: Diagnosis not present

## 2019-05-11 DIAGNOSIS — Z7409 Other reduced mobility: Secondary | ICD-10-CM | POA: Diagnosis not present

## 2019-05-11 DIAGNOSIS — M25662 Stiffness of left knee, not elsewhere classified: Secondary | ICD-10-CM | POA: Diagnosis not present

## 2019-05-11 DIAGNOSIS — R29898 Other symptoms and signs involving the musculoskeletal system: Secondary | ICD-10-CM | POA: Diagnosis not present

## 2019-05-11 MED ORDER — FLUCONAZOLE 150 MG PO TABS: 150.0000 mg | ORAL_TABLET | Freq: Once | ORAL | 1 refills | Status: AC

## 2019-05-11 NOTE — Telephone Encounter (Signed)
RX pended 

## 2019-05-12 DIAGNOSIS — Z23 Encounter for immunization: Secondary | ICD-10-CM | POA: Diagnosis not present

## 2019-05-12 DIAGNOSIS — E2749 Other adrenocortical insufficiency: Secondary | ICD-10-CM | POA: Diagnosis not present

## 2019-05-27 DIAGNOSIS — R519 Headache, unspecified: Secondary | ICD-10-CM | POA: Diagnosis not present

## 2019-05-31 DIAGNOSIS — R600 Localized edema: Secondary | ICD-10-CM | POA: Diagnosis not present

## 2019-05-31 DIAGNOSIS — J342 Deviated nasal septum: Secondary | ICD-10-CM | POA: Diagnosis not present

## 2019-05-31 DIAGNOSIS — R519 Headache, unspecified: Secondary | ICD-10-CM | POA: Diagnosis not present

## 2019-05-31 DIAGNOSIS — Z972 Presence of dental prosthetic device (complete) (partial): Secondary | ICD-10-CM | POA: Diagnosis not present

## 2019-05-31 DIAGNOSIS — J3489 Other specified disorders of nose and nasal sinuses: Secondary | ICD-10-CM | POA: Diagnosis not present

## 2019-06-02 DIAGNOSIS — F4323 Adjustment disorder with mixed anxiety and depressed mood: Secondary | ICD-10-CM | POA: Diagnosis not present

## 2019-06-10 DIAGNOSIS — R29898 Other symptoms and signs involving the musculoskeletal system: Secondary | ICD-10-CM | POA: Diagnosis not present

## 2019-06-10 DIAGNOSIS — S83512D Sprain of anterior cruciate ligament of left knee, subsequent encounter: Secondary | ICD-10-CM | POA: Diagnosis not present

## 2019-06-10 DIAGNOSIS — M25662 Stiffness of left knee, not elsewhere classified: Secondary | ICD-10-CM | POA: Diagnosis not present

## 2019-06-10 DIAGNOSIS — Z7409 Other reduced mobility: Secondary | ICD-10-CM | POA: Diagnosis not present

## 2019-06-14 ENCOUNTER — Other Ambulatory Visit: Payer: Self-pay | Admitting: Family Medicine

## 2019-06-21 DIAGNOSIS — Z7409 Other reduced mobility: Secondary | ICD-10-CM | POA: Diagnosis not present

## 2019-06-21 DIAGNOSIS — R29898 Other symptoms and signs involving the musculoskeletal system: Secondary | ICD-10-CM | POA: Diagnosis not present

## 2019-06-21 DIAGNOSIS — M25662 Stiffness of left knee, not elsewhere classified: Secondary | ICD-10-CM | POA: Diagnosis not present

## 2019-06-21 DIAGNOSIS — S83512D Sprain of anterior cruciate ligament of left knee, subsequent encounter: Secondary | ICD-10-CM | POA: Diagnosis not present

## 2019-06-22 DIAGNOSIS — F458 Other somatoform disorders: Secondary | ICD-10-CM | POA: Diagnosis not present

## 2019-06-22 DIAGNOSIS — G501 Atypical facial pain: Secondary | ICD-10-CM | POA: Diagnosis not present

## 2019-07-27 ENCOUNTER — Encounter: Payer: Self-pay | Admitting: Family Medicine

## 2019-07-27 DIAGNOSIS — N390 Urinary tract infection, site not specified: Secondary | ICD-10-CM | POA: Diagnosis not present

## 2019-07-27 DIAGNOSIS — R300 Dysuria: Secondary | ICD-10-CM | POA: Diagnosis not present

## 2019-08-06 ENCOUNTER — Other Ambulatory Visit: Payer: Self-pay | Admitting: *Deleted

## 2019-08-09 MED ORDER — ZOLPIDEM TARTRATE 10 MG PO TABS: 5.0000 mg | ORAL_TABLET | Freq: Every evening | ORAL | 0 refills | Status: DC | PRN

## 2019-08-09 NOTE — Telephone Encounter (Signed)
ambien refilled.  BUt didn't sent the tamezpam.  She really shouldn't take both of these at the same time. They are both for sleep and shouldn't be mixed for safety reasons.

## 2019-08-09 NOTE — Telephone Encounter (Signed)
Pt advised..[name], CMA  

## 2019-08-17 ENCOUNTER — Encounter: Payer: Self-pay | Admitting: Family Medicine

## 2019-08-19 ENCOUNTER — Ambulatory Visit: Payer: BLUE CROSS/BLUE SHIELD | Attending: Internal Medicine

## 2019-08-19 DIAGNOSIS — Z23 Encounter for immunization: Secondary | ICD-10-CM

## 2019-08-19 NOTE — Progress Notes (Signed)
   Covid-19 Vaccination Clinic  Name:  [name]    MRN: [id number] DOB: 17-Dec-1970  08/19/2019  [name] was observed post Covid-19 immunization for 15 minutes without incident. She was provided with Vaccine Information Sheet and instruction to access the V-Safe system.   [name] was instructed to call 911 with any severe reactions post vaccine: Marland Kitchen Difficulty breathing  . Swelling of face and throat  . A fast heartbeat  . A bad rash all over body  . Dizziness and weakness   Immunizations Administered    Name Date Dose VIS Date Route   Pfizer COVID-19 Vaccine 08/19/2019  2:54 PM 0.3 mL 04/30/2019 Intramuscular   Manufacturer: [place]   Lot: [id number]   [place]: [id number]

## 2019-09-15 ENCOUNTER — Ambulatory Visit: Payer: BLUE CROSS/BLUE SHIELD | Attending: Internal Medicine

## 2019-09-15 DIAGNOSIS — Z23 Encounter for immunization: Secondary | ICD-10-CM

## 2019-09-15 NOTE — Progress Notes (Signed)
   Covid-19 Vaccination Clinic  Name:  RUSSIA CIABURRI    MRN: [id number] DOB: Mar 05, 1971  09/15/2019  Ms. Schwandt was observed post Covid-19 immunization for 15 minutes without incident. She was provided with Vaccine Information Sheet and instruction to access the V-Safe system.   Ms. Oni was instructed to call 911 with any severe reactions post vaccine: Marland Kitchen Difficulty breathing  . Swelling of face and throat  . A fast heartbeat  . A bad rash all over body  . Dizziness and weakness   Immunizations Administered    Name Date Dose VIS Date Route   Pfizer COVID-19 Vaccine 09/15/2019  4:24 PM 0.3 mL 07/14/2018 Intramuscular   Manufacturer: [place]   Lot: [id number]   [place]: [id number]

## 2019-10-27 ENCOUNTER — Encounter: Payer: Self-pay | Admitting: Family Medicine

## 2019-10-27 ENCOUNTER — Other Ambulatory Visit: Payer: Self-pay | Admitting: *Deleted

## 2019-10-27 ENCOUNTER — Other Ambulatory Visit: Payer: Self-pay | Admitting: Family Medicine

## 2019-10-27 MED ORDER — ZOLPIDEM TARTRATE 10 MG PO TABS: 5.0000 mg | ORAL_TABLET | Freq: Every evening | ORAL | 0 refills | Status: DC | PRN

## 2019-10-27 MED ORDER — LINACLOTIDE 145 MCG PO CAPS: 145.0000 ug | ORAL_CAPSULE | Freq: Every day | ORAL | 3 refills | Status: DC

## 2019-12-29 ENCOUNTER — Other Ambulatory Visit: Payer: Self-pay | Admitting: Family Medicine

## 2019-12-30 ENCOUNTER — Encounter: Payer: Self-pay | Admitting: *Deleted

## 2020-01-06 DIAGNOSIS — M25551 Pain in right hip: Secondary | ICD-10-CM | POA: Diagnosis not present

## 2020-01-06 DIAGNOSIS — M5136 Other intervertebral disc degeneration, lumbar region: Secondary | ICD-10-CM | POA: Diagnosis not present

## 2020-01-14 DIAGNOSIS — M25551 Pain in right hip: Secondary | ICD-10-CM | POA: Diagnosis not present

## 2020-01-25 DIAGNOSIS — Z20822 Contact with and (suspected) exposure to covid-19: Secondary | ICD-10-CM | POA: Diagnosis not present

## 2020-01-25 DIAGNOSIS — Z03818 Encounter for observation for suspected exposure to other biological agents ruled out: Secondary | ICD-10-CM | POA: Diagnosis not present

## 2020-02-03 ENCOUNTER — Other Ambulatory Visit: Payer: Self-pay | Admitting: Family Medicine

## 2020-02-08 ENCOUNTER — Other Ambulatory Visit: Payer: Self-pay | Admitting: Sports Medicine

## 2020-02-08 NOTE — Telephone Encounter (Signed)
To PCP

## 2020-02-15 ENCOUNTER — Telehealth: Payer: Self-pay

## 2020-02-15 ENCOUNTER — Ambulatory Visit (INDEPENDENT_AMBULATORY_CARE_PROVIDER_SITE_OTHER): Payer: BLUE CROSS/BLUE SHIELD | Admitting: Family Medicine

## 2020-02-15 ENCOUNTER — Encounter: Payer: Self-pay | Admitting: Family Medicine

## 2020-02-15 VITALS — BP 110/75 | HR 69 | Ht 65.0 in | Wt 139.0 lb

## 2020-02-15 DIAGNOSIS — F5101 Primary insomnia: Secondary | ICD-10-CM

## 2020-02-15 DIAGNOSIS — Z23 Encounter for immunization: Secondary | ICD-10-CM

## 2020-02-15 DIAGNOSIS — K5909 Other constipation: Secondary | ICD-10-CM

## 2020-02-15 DIAGNOSIS — F4329 Adjustment disorder with other symptoms: Secondary | ICD-10-CM | POA: Diagnosis not present

## 2020-02-15 MED ORDER — VENLAFAXINE HCL ER 37.5 MG PO CP24: 37.5000 mg | ORAL_CAPSULE | Freq: Every day | ORAL | 1 refills | Status: DC

## 2020-02-15 MED ORDER — TEMAZEPAM 22.5 MG PO CAPS: 22.5000 mg | ORAL_CAPSULE | Freq: Every evening | ORAL | 3 refills | Status: DC | PRN

## 2020-02-15 MED ORDER — ZOLPIDEM TARTRATE 10 MG PO TABS: 5.0000 mg | ORAL_TABLET | Freq: Every evening | ORAL | 1 refills | Status: DC | PRN

## 2020-02-15 NOTE — Telephone Encounter (Signed)
Received call from [place] that [place] for Linzess was approved.   Auth dates valid 9/28 until 02/14/20.  Attempted to call patient, no answer and voicemail full

## 2020-02-15 NOTE — Progress Notes (Signed)
Established Patient Office Visit  Subjective:  Patient ID: [name], female    DOB: 04/04/1971  Age: 49 y.o. MRN: [id number]  CC:  Chief Complaint  Patient presents with  . Insomnia    HPI [name] presents for follow-up insomnia.  She currently uses zolpidem and temazepam ARN for sleep.  He does not use them at the same time but does need refills today.  She also reports feeling down and depressed as well as very anxious today.  Says ever since the spring her 35 year old daughter became pregnant and this has been stressful.  Her father was diagnosed with heart failure and recently fell and fractured his tibia and was requiring intensive care.  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  She just felt like she has not been able to take care of herself and work out and exercise.  He was on Zoloft years ago but it caused some sexual dysfunction.  She is also having difficulty getting her Linzess covered with insurance she says the pharmacist told her that it needed a prior authorization otherwise is about $500.  Significant difficulty with her bowel movements if she is not able to take it for several days.  Past Medical History:  Diagnosis Date  . Asthma    allergy, exercise induced  . Atrial tachycardia ([place])   . Decreased cortisol level ([place])   . Lethargic   . Ovarian cyst   . PONV (postoperative nausea and vomiting)     Past Surgical History:  Procedure Laterality Date  . ablasion    . ANTERIOR AND POSTERIOR REPAIR N/A 04/29/2016   Procedure: ANTERIOR (CYSTOCELE) AND POSTERIOR REPAIR (RECTOCELE);  Surgeon: Janyth Contes, MD;  Location: [facility];  Service: Gynecology;  Laterality: N/A;  . ANTERIOR CRUCIATE LIGAMENT REPAIR Left   . BLADDER SUSPENSION  04/29/2016   Procedure: Attempted TRANSVAGINAL TAPE (TVT) PROCEDURE, Cystotomy;  Surgeon:  Cheri Fowler, MD;  Location: [facility];  Service: Gynecology;;  . BREAST SURGERY     augmentation  . CARDIAC ELECTROPHYSIOLOGY STUDY AND ABLATION     twice  . CYSTOSCOPY N/A 04/29/2016   Procedure: CYSTOSCOPY;  Surgeon: Cheri Fowler, MD;  Location: [facility];  Service: Gynecology;  Laterality: N/A;  . LAPAROSCOPIC VAGINAL HYSTERECTOMY WITH SALPINGECTOMY Bilateral 04/29/2016   Procedure: LAPAROSCOPIC ASSISTED VAGINAL HYSTERECTOMY WITH Bilateral  SALPINGECTOMY;  Surgeon: Janyth Contes, MD;  Location: [facility];  Service: Gynecology;  Laterality: Bilateral;  . MYRINGOTOMY    . NASAL SEPTUM SURGERY    . TONSILLECTOMY AND ADENOIDECTOMY    . TUBAL LIGATION    . WISDOM TOOTH EXTRACTION      History reviewed. No pertinent family history.  Social History   Socioeconomic History  . Marital status: Divorced    Spouse name: Not on file  . Number of children: Not on file  . Years of education: Not on file  . Highest education level: Not on file  Occupational History  . Not on file  Tobacco Use  . Smoking status: Never Smoker  . Smokeless tobacco: Never Used  Substance and Sexual Activity  . Alcohol use: Yes  . Drug use: No  . Sexual activity: Not on file  Other Topics Concern  . Not on file  Social History Narrative  . Not on file   Social Determinants of Health  Financial Resource Strain:   . Difficulty of Paying Living Expenses: Not on file  Food Insecurity:   . Worried About Charity fundraiser in the Last Year: Not on file  . Ran Out of Food in the Last Year: Not on file  Transportation Needs:   . Lack of Transportation (Medical): Not on file  . Lack of Transportation (Non-Medical): Not on file  Physical Activity:   . Days of Exercise per Week: Not on file  . Minutes of Exercise per Session: Not on file  Stress:   . Feeling of Stress : Not on file  Social Connections:   . Frequency of Communication with Friends and Family: Not on file  . Frequency of Social  Gatherings with Friends and Family: Not on file  . Attends Religious Services: Not on file  . Active Member of Clubs or Organizations: Not on file  . Attends Archivist Meetings: Not on file  . Marital Status: Not on file  Intimate Partner Violence:   . Fear of Current or Ex-Partner: Not on file  . Emotionally Abused: Not on file  . Physically Abused: Not on file  . Sexually Abused: Not on file    Outpatient Medications Prior to Visit  Medication Sig Dispense Refill  . celecoxib (CELEBREX) 200 MG capsule Take 1 capsule by mouth in the morning and at bedtime.  1  . furosemide (LASIX) 20 MG tablet TK 1 T PO D    . ibuprofen (ADVIL,MOTRIN) 800 MG tablet     . linaclotide (LINZESS) 145 MCG CAPS capsule Take 1 capsule (145 mcg total) by mouth daily before breakfast. 90 capsule 3  . valACYclovir (VALTREX) 1000 MG tablet TAKE 1 TABLET BY MOUTH DAILY 30 tablet 3  . temazepam (RESTORIL) 22.5 MG capsule Take 1 capsule (22.5 mg total) by mouth at bedtime as needed for sleep. 30 capsule 3  . zolpidem (AMBIEN) 10 MG tablet Take 0.5-1 tablets (5-10 mg total) by mouth at bedtime as needed for sleep. [place].APPOINTMENT REQUIRED FOR FUTURE REFILLS.PLEASE CALL OFFICE TO SCHEDULE APPOINTMENT 30 tablet 0  . meloxicam (MOBIC) 15 MG tablet Take 1 tablet by mouth daily as needed.     No facility-administered medications prior to visit.    Allergies  Allergen Reactions  . Demerol Nausea And Vomiting and Swelling  . Mobic [Meloxicam] Palpitations    Caused my heart to race    ROS Review of Systems    Objective:    Physical Exam Constitutional:      Appearance: She is well-developed.  HENT:     Head: Normocephalic and atraumatic.  Cardiovascular:     Rate and Rhythm: Normal rate and regular rhythm.     Heart sounds: Normal heart sounds.  Pulmonary:     Effort: Pulmonary effort is normal.     Breath sounds: Normal breath sounds.  Skin:    General: Skin is warm and dry.   Neurological:     Mental Status: She is alert and oriented to person, place, and time.  Psychiatric:        Behavior: Behavior normal.     BP 110/75   Pulse 69   Ht 5\' 5"  (1.651 m)   Wt 139 lb (63 kg)   SpO2 100%   BMI 23.13 kg/m  Wt Readings from Last 3 Encounters:  02/15/20 139 lb (63 kg)  08/31/18 135 lb (61.2 kg)  07/22/18 142 lb (64.4 kg)     Health Maintenance  Due  Topic Date Due  . Hepatitis C Screening  Never done  . HIV Screening  Never done  . MAMMOGRAM  06/28/2017  . PAP SMEAR-Modifier  01/23/2019    There are no preventive care reminders to display for this patient.  Lab Results  Component Value Date   TSH 1.44 07/22/2018   Lab Results  Component Value Date   WBC 7.6 07/22/2018   HGB 14.1 07/22/2018   HCT 42.2 07/22/2018   MCV 89.4 07/22/2018   PLT 437 (H) 07/22/2018   Lab Results  Component Value Date   NA 138 07/22/2018   K 4.1 07/22/2018   CO2 28 07/22/2018   GLUCOSE 88 07/22/2018   BUN 9 07/22/2018   CREATININE 0.64 07/22/2018   BILITOT 0.4 07/22/2018   ALKPHOS 52 04/17/2016   AST 18 07/22/2018   ALT 8 07/22/2018   PROT 7.1 07/22/2018   ALBUMIN 3.6 04/17/2016   CALCIUM 9.4 07/22/2018   ANIONGAP 8 04/30/2016   No results found for: CHOL No results found for: HDL No results found for: LDLCALC No results found for: TRIG No results found for: CHOLHDL Lab Results  Component Value Date   HGBA1C 5.1 07/22/2018      Assessment & Plan:   Problem List Items Addressed This Visit      Digestive   Chronic constipation    Insurance requiring prior authorization on Linzess.  Will forward this to [place] to initiate prior Auth.        Other   Stress and adjustment reaction    [facility] options.  She is open to seeing a therapist she says she is to do that years ago and it was really helpful.  She would also like to consider medication though sertraline caused him sexual dysfunction and she thinks she may have tried Lexapro in the past  as well it sounds familiar she cannot remember exactly why she did not continue it so we will start with a low-dose of venlafaxine.  Prescription sent to pharmacy follow-up in 4 weeks.  Ahead and place referral to Francie Massing for virtual visits      Relevant Orders   Ambulatory referral to [facility]   Primary insomnia - Primary    Doing well with medications taking them as needed.  No side effects.  Refill sent to pharmacy.  Follow-up in 6 months.       Other Visit Diagnoses    Need for immunization against influenza       Relevant Orders   Flu Vaccine QUAD 36+ mos IM (Completed)      Encouraged her to schedule mammogram and colonoscopy.    Meds ordered this encounter  Medications  . temazepam (RESTORIL) 22.5 MG capsule    Sig: Take 1 capsule (22.5 mg total) by mouth at bedtime as needed for sleep.    Dispense:  30 capsule    Refill:  3  . zolpidem (AMBIEN) 10 MG tablet    Sig: Take 0.5-1 tablets (5-10 mg total) by mouth at bedtime as needed for sleep.    Dispense:  90 tablet    Refill:  1  . venlafaxine XR (EFFEXOR XR) 37.5 MG 24 hr capsule    Sig: Take 1 capsule (37.5 mg total) by mouth daily with breakfast. OK to increase to 2 cap daily after one week    Dispense:  60 capsule    Refill:  1    Follow-up: Return in about 4 weeks (around 03/14/2020) for  New start medication.    [name], MD

## 2020-02-15 NOTE — Assessment & Plan Note (Signed)
[facility].  She is open to seeing a therapist she says she is to do that years ago and it was really helpful.  She would also like to consider medication though sertraline caused him sexual dysfunction and she thinks she may have tried Lexapro in the past as well it sounds familiar she cannot remember exactly why she did not continue it so we will start with a low-dose of venlafaxine.  Prescription sent to pharmacy follow-up in 4 weeks.  Ahead and place referral to Francie Massing for virtual visits

## 2020-02-15 NOTE — Progress Notes (Signed)
Sent for records from Dr. Roe Rutherford of[facility].

## 2020-02-15 NOTE — Assessment & Plan Note (Signed)
Doing well with medications taking them as needed.  No side effects.  Refill sent to pharmacy.  Follow-up in 6 months.

## 2020-02-15 NOTE — Assessment & Plan Note (Signed)
Insurance requiring prior authorization on [place].  Will forward this to [place] to initiate prior Auth.

## 2020-02-16 ENCOUNTER — Telehealth: Payer: Self-pay | Admitting: Family Medicine

## 2020-02-16 NOTE — Telephone Encounter (Signed)
Received fax for PA on Temazepam sent through cover my meds waiting on determination. - CF

## 2020-02-22 NOTE — Telephone Encounter (Signed)
Temazepam does not meet medical necessity. GoodRx web site is down at the moment. We can check the price later.

## 2020-02-23 ENCOUNTER — Other Ambulatory Visit: Payer: Self-pay | Admitting: Family Medicine

## 2020-02-23 MED ORDER — TEMAZEPAM 15 MG PO CAPS: 15.0000 mg | ORAL_CAPSULE | Freq: Every evening | ORAL | 3 refills | Status: DC | PRN

## 2020-02-23 NOTE — Progress Notes (Signed)
restoril 22.5mg  not covered by insurance. Will change to 15mg  for PRN use will also cover Lunesta, [place], or Ambien

## 2020-03-02 NOTE — Telephone Encounter (Signed)
Correction, valid 02/15/20 until 02/13/21  Patient is aware

## 2020-03-14 ENCOUNTER — Ambulatory Visit: Payer: BLUE CROSS/BLUE SHIELD | Admitting: Family Medicine

## 2020-03-23 DIAGNOSIS — Z9889 Other specified postprocedural states: Secondary | ICD-10-CM | POA: Diagnosis not present

## 2020-03-23 DIAGNOSIS — I471 Supraventricular tachycardia: Secondary | ICD-10-CM | POA: Diagnosis not present

## 2020-03-23 DIAGNOSIS — R002 Palpitations: Secondary | ICD-10-CM | POA: Diagnosis not present

## 2020-03-24 DIAGNOSIS — R001 Bradycardia, unspecified: Secondary | ICD-10-CM | POA: Diagnosis not present

## 2020-03-27 ENCOUNTER — Telehealth: Payer: Self-pay | Admitting: Family Medicine

## 2020-03-27 NOTE — Telephone Encounter (Signed)
Please call pt: she id overdue for pap smear.  She was due in 01/2019.  Please encouraged her to schedule with [place]

## 2020-03-30 NOTE — Telephone Encounter (Signed)
LMOM making patient aware she is overdue for pap and encouraged her to schedule. To call back with any questions.

## 2020-04-09 DIAGNOSIS — R002 Palpitations: Secondary | ICD-10-CM | POA: Diagnosis not present

## 2020-05-08 DIAGNOSIS — I498 Other specified cardiac arrhythmias: Secondary | ICD-10-CM | POA: Diagnosis not present

## 2020-05-08 DIAGNOSIS — R002 Palpitations: Secondary | ICD-10-CM | POA: Diagnosis not present

## 2020-05-09 DIAGNOSIS — R9431 Abnormal electrocardiogram [ECG] [EKG]: Secondary | ICD-10-CM | POA: Diagnosis not present

## 2020-05-15 ENCOUNTER — Encounter: Payer: Self-pay | Admitting: Family Medicine

## 2020-05-15 ENCOUNTER — Telehealth (INDEPENDENT_AMBULATORY_CARE_PROVIDER_SITE_OTHER): Payer: BLUE CROSS/BLUE SHIELD | Admitting: Family Medicine

## 2020-05-15 DIAGNOSIS — Z20822 Contact with and (suspected) exposure to covid-19: Secondary | ICD-10-CM

## 2020-05-15 DIAGNOSIS — R059 Cough, unspecified: Secondary | ICD-10-CM | POA: Diagnosis not present

## 2020-05-15 LAB — POCT INFLUENZA A/B
Influenza A, POC: NEGATIVE
Influenza B, POC: NEGATIVE

## 2020-05-15 NOTE — Progress Notes (Signed)
Virtual Visit via telephone note  I connected with [name] on 05/15/20 at  2:00 PM EST by telephone and verified that I am speaking with the correct person using two identifiers.   I discussed the limitations of evaluation and management by telemedicine and the availability of in person appointments. The patient expressed understanding and agreed to proceed.  Patient location: Provider location: in office  Subjective:    CC: Sick visit.    HPI: She reports that her sxs began 4 days ago. It started as a dry throat the first day. On days 2-3 she said that she felt tired, achy,runny nose and dry cough. She has felt "hot but was " not febrile.  She says she is actually checked her temperature out several times.  She has been getting a headache with it and then last night did have some body aches.  Was around her granddaughter about 7 days ago who had a runny.  Positive contact about 8 days ago.      Past medical history, Surgical history, Family history not pertinant except as noted below, Social history, Allergies, and medications have been entered into the medical record, reviewed, and corrections made.   Review of Systems: No fevers, chills, night sweats, weight loss, chest pain, or shortness of breath.   Objective:    General: Speaking clearly in complete sentences without any shortness of breath.  Alert and oriented x3.  Normal judgment. No apparent acute distress.    Impression and Recommendations:    No problem-specific Assessment & Plan notes found for this encounter.  Cough-likely viral illness will test for Covid and flu.  She try to see if she can get tested at [facility] or local [facility] but they were booked.  Discussed doing PCR here and rapid test for flu.  Will call with results once available.  Recommend symptomatic care.  Call if new symptoms, worsening symptoms or not improving over the next week  Orders Placed This Encounter  Procedures  . Novel Coronavirus,  NAA ([facility])    Order Specific Question:   Is this test for diagnosis or screening    Answer:   Diagnosis of ill patient    Order Specific Question:   Symptomatic for COVID-19 as defined by CDC    Answer:   Yes    Order Specific Question:   Date of Symptom Onset    Answer:   05/13/2020    Order Specific Question:   Hospitalized for COVID-19    Answer:   No    Order Specific Question:   Admitted to ICU for COVID-19    Answer:   No    Order Specific Question:   Previously tested for COVID-19    Answer:   Yes    Order Specific Question:   Resident in a congregate (group) care setting    Answer:   No    Order Specific Question:   Is the patient student?    Answer:   No    Order Specific Question:   Employed in healthcare setting    Answer:   No    Order Specific Question:   Pregnant    Answer:   No    Order Specific Question:   Has patient completed COVID vaccination(s) (2 doses of Pfizer/Moderna 1 dose of [employer])    Answer:   Unknown  . POCT Influenza A/B      Time spent in encounter 21 minutes  I discussed the assessment and  treatment plan with the patient. The patient was provided an opportunity to ask questions and all were answered. The patient agreed with the plan and demonstrated an understanding of the instructions.   The patient was advised to call back or seek an in-person evaluation if the symptoms worsen or if the condition fails to improve as anticipated.   Kristen Lecher, MD

## 2020-05-15 NOTE — Progress Notes (Signed)
She reports that her sxs began 4 days ago. It started as a dry throat the first day. On days 2-3 she said that she felt tired, achy,runny nose and dry cough. She has felt "hot but was "

## 2020-05-17 ENCOUNTER — Encounter: Payer: Self-pay | Admitting: Family Medicine

## 2020-05-17 LAB — NOVEL CORONAVIRUS, NAA: SARS-CoV-2, NAA: NOT DETECTED

## 2020-05-17 LAB — SARS-COV-2, NAA 2 DAY TAT

## 2020-05-18 MED ORDER — AMOXICILLIN-POT CLAVULANATE 875-125 MG PO TABS: 1.0000 | ORAL_TABLET | Freq: Two times a day (BID) | ORAL | 0 refills | Status: DC

## 2020-06-12 ENCOUNTER — Encounter: Payer: Self-pay | Admitting: Family Medicine

## 2020-06-13 NOTE — Telephone Encounter (Signed)
With 2+ home test she is definitely positive so does not need an additional test unless she decides to have it for work purposes.  Please give her list of symptomatic care things to do recommend the [facility] website for additional information.  She is not high enough for [place] to qualify for any type of infusion etc.  She is also welcome to do a virtual visit to if she is still not feeling well.

## 2020-06-17 ENCOUNTER — Other Ambulatory Visit: Payer: Self-pay | Admitting: Family Medicine

## 2020-06-20 DIAGNOSIS — Z6824 Body mass index (BMI) 24.0-24.9, adult: Secondary | ICD-10-CM | POA: Diagnosis not present

## 2020-06-20 DIAGNOSIS — Z780 Asymptomatic menopausal state: Secondary | ICD-10-CM | POA: Diagnosis not present

## 2020-06-20 DIAGNOSIS — Z01419 Encounter for gynecological examination (general) (routine) without abnormal findings: Secondary | ICD-10-CM | POA: Diagnosis not present

## 2020-06-20 DIAGNOSIS — Z1151 Encounter for screening for human papillomavirus (HPV): Secondary | ICD-10-CM | POA: Diagnosis not present

## 2020-06-20 DIAGNOSIS — Z1231 Encounter for screening mammogram for malignant neoplasm of breast: Secondary | ICD-10-CM | POA: Diagnosis not present

## 2020-06-20 DIAGNOSIS — Z130 Encounter for screening for diseases of the blood and blood-forming organs and certain disorders involving the immune mechanism: Secondary | ICD-10-CM | POA: Diagnosis not present

## 2020-06-20 DIAGNOSIS — Z124 Encounter for screening for malignant neoplasm of cervix: Secondary | ICD-10-CM | POA: Diagnosis not present

## 2020-07-05 ENCOUNTER — Other Ambulatory Visit: Payer: Self-pay | Admitting: Obstetrics and Gynecology

## 2020-07-05 DIAGNOSIS — R928 Other abnormal and inconclusive findings on diagnostic imaging of breast: Secondary | ICD-10-CM

## 2020-07-18 ENCOUNTER — Other Ambulatory Visit: Payer: Self-pay | Admitting: Obstetrics and Gynecology

## 2020-07-18 ENCOUNTER — Ambulatory Visit
Admission: RE | Admit: 2020-07-18 | Discharge: 2020-07-18 | Disposition: A | Discharge status: Home / Self Care | Payer: BC Managed Care – PPO | Source: Ambulatory Visit | Attending: Obstetrics and Gynecology | Admitting: Obstetrics and Gynecology

## 2020-07-18 ENCOUNTER — Ambulatory Visit
Admission: RE | Admit: 2020-07-18 | Discharge: 2020-07-18 | Disposition: A | Discharge status: Home / Self Care | Payer: BLUE CROSS/BLUE SHIELD | Source: Ambulatory Visit | Attending: Obstetrics and Gynecology | Admitting: Obstetrics and Gynecology

## 2020-07-18 ENCOUNTER — Other Ambulatory Visit: Payer: Self-pay

## 2020-07-18 DIAGNOSIS — R928 Other abnormal and inconclusive findings on diagnostic imaging of breast: Secondary | ICD-10-CM

## 2020-07-18 DIAGNOSIS — N6489 Other specified disorders of breast: Secondary | ICD-10-CM

## 2020-07-18 DIAGNOSIS — R922 Inconclusive mammogram: Secondary | ICD-10-CM | POA: Diagnosis not present

## 2020-08-30 ENCOUNTER — Other Ambulatory Visit: Payer: Self-pay

## 2020-08-30 MED ORDER — LINACLOTIDE 145 MCG PO CAPS: 145.0000 ug | ORAL_CAPSULE | Freq: Every day | ORAL | 3 refills | Status: DC

## 2020-08-30 MED ORDER — TEMAZEPAM 22.5 MG PO CAPS: 22.5000 mg | ORAL_CAPSULE | Freq: Every evening | ORAL | 3 refills | Status: DC | PRN

## 2020-08-31 ENCOUNTER — Telehealth: Payer: Self-pay

## 2020-08-31 NOTE — Telephone Encounter (Signed)
PA for the Temazepam was denied. A fax with reason will be faxed to the office.

## 2020-08-31 NOTE — Telephone Encounter (Signed)
PA for Temazepam submitted through CoverMyMeds.

## 2020-09-08 ENCOUNTER — Telehealth: Payer: Self-pay | Admitting: *Deleted

## 2020-09-08 ENCOUNTER — Other Ambulatory Visit: Payer: Self-pay | Admitting: Family Medicine

## 2020-09-08 MED ORDER — TEMAZEPAM 15 MG PO CAPS: 15.0000 mg | ORAL_CAPSULE | Freq: Every evening | ORAL | 3 refills | Status: DC | PRN

## 2020-09-08 NOTE — Telephone Encounter (Signed)
Patient has an appt scheduled on 09/21/20. AM

## 2020-09-08 NOTE — Progress Notes (Signed)
The temazepam 22.5 not covered. So changed new medication

## 2020-09-08 NOTE — Telephone Encounter (Signed)
Please call pt and have her schedule an appt for insomnia, anxiety, medication refills. TY!

## 2020-09-14 DIAGNOSIS — F4323 Adjustment disorder with mixed anxiety and depressed mood: Secondary | ICD-10-CM | POA: Diagnosis not present

## 2020-09-21 ENCOUNTER — Ambulatory Visit: Payer: BC Managed Care – PPO | Admitting: Family Medicine

## 2020-09-27 DIAGNOSIS — H9203 Otalgia, bilateral: Secondary | ICD-10-CM | POA: Diagnosis not present

## 2020-09-27 DIAGNOSIS — J00 Acute nasopharyngitis [common cold]: Secondary | ICD-10-CM | POA: Diagnosis not present

## 2020-10-02 ENCOUNTER — Ambulatory Visit (INDEPENDENT_AMBULATORY_CARE_PROVIDER_SITE_OTHER): Payer: BC Managed Care – PPO | Admitting: Family Medicine

## 2020-10-02 ENCOUNTER — Encounter: Payer: Self-pay | Admitting: Family Medicine

## 2020-10-02 ENCOUNTER — Other Ambulatory Visit: Payer: Self-pay

## 2020-10-02 VITALS — BP 130/73 | HR 75 | Temp 98.4°F | Ht 65.0 in | Wt 142.0 lb

## 2020-10-02 DIAGNOSIS — H6983 Other specified disorders of Eustachian tube, bilateral: Secondary | ICD-10-CM | POA: Diagnosis not present

## 2020-10-02 DIAGNOSIS — J329 Chronic sinusitis, unspecified: Secondary | ICD-10-CM | POA: Diagnosis not present

## 2020-10-02 DIAGNOSIS — F5101 Primary insomnia: Secondary | ICD-10-CM | POA: Diagnosis not present

## 2020-10-02 DIAGNOSIS — J40 Bronchitis, not specified as acute or chronic: Secondary | ICD-10-CM

## 2020-10-02 MED ORDER — PREDNISONE 20 MG PO TABS: 40.0000 mg | ORAL_TABLET | Freq: Every day | ORAL | 0 refills | Status: DC

## 2020-10-02 MED ORDER — AZITHROMYCIN 250 MG PO TABS: ORAL_TABLET | ORAL | 0 refills | Status: AC

## 2020-10-02 MED ORDER — FLUCONAZOLE 150 MG PO TABS: 150.0000 mg | ORAL_TABLET | Freq: Every day | ORAL | 0 refills | Status: DC

## 2020-10-02 NOTE — Assessment & Plan Note (Signed)
Place referral for sleep medicine specialist to really help [place] figure out what would work well for her.  Her biggest issue is that her mind just races at night she has a hard time shutting it off.  And she feels like the temazepam is no longer working well.  She had tried Ambien previously but really just wanted to get off of the medication.

## 2020-10-02 NOTE — Progress Notes (Signed)
Acute Office Visit  Subjective:    Patient ID: [name], female    DOB: 1970-12-19, 50 y.o.   MRN: [id number]  Chief Complaint  Patient presents with  . Insomnia          HPI Patient is in today for insomnia for her temazepam.  She feels like her medication is no longer working she still having significant trouble falling asleep.  She feels like her anxiety levels are.   She has had a sinus infection x 1 week and she is being treated with amoxicillin.  She has had 2 neg COVID tests.  She has been noticing some wheezing she is been running a low-grade temp.  She has had congestion and more recently runny nose.  She had a really sore throat the last couple of days even though it did not start with a sore throat.  Past Medical History:  Diagnosis Date  . Asthma    allergy, exercise induced  . Atrial tachycardia ([place])   . Decreased cortisol level ([place])   . Lethargic   . Ovarian cyst   . PONV (postoperative nausea and vomiting)     Past Surgical History:  Procedure Laterality Date  . ablasion    . ANTERIOR AND POSTERIOR REPAIR N/A 04/29/2016   Procedure: ANTERIOR (CYSTOCELE) AND POSTERIOR REPAIR (RECTOCELE);  Surgeon: Janyth Contes, MD;  Location: [facility];  Service: Gynecology;  Laterality: N/A;  . ANTERIOR CRUCIATE LIGAMENT REPAIR Left   . BLADDER SUSPENSION  04/29/2016   Procedure: Attempted TRANSVAGINAL TAPE (TVT) PROCEDURE, Cystotomy;  Surgeon: Cheri Fowler, MD;  Location: [facility];  Service: Gynecology;;  . BREAST SURGERY     augmentation  . CARDIAC ELECTROPHYSIOLOGY STUDY AND ABLATION     twice  . CYSTOSCOPY N/A 04/29/2016   Procedure: CYSTOSCOPY;  Surgeon: Cheri Fowler, MD;  Location: [facility];  Service: Gynecology;  Laterality: N/A;  . LAPAROSCOPIC VAGINAL HYSTERECTOMY WITH SALPINGECTOMY Bilateral 04/29/2016   Procedure: LAPAROSCOPIC ASSISTED VAGINAL HYSTERECTOMY WITH Bilateral  SALPINGECTOMY;  Surgeon: Janyth Contes, MD;  Location: [facility];   Service: Gynecology;  Laterality: Bilateral;  . MYRINGOTOMY    . NASAL SEPTUM SURGERY    . TONSILLECTOMY AND ADENOIDECTOMY    . TOTAL LAPAROSCOPIC HYSTERECTOMY WITH SALPINGECTOMY Bilateral 04/29/2016  . TOTAL VAGINAL HYSTERECTOMY  04/29/2016  . TUBAL LIGATION    . WISDOM TOOTH EXTRACTION      No family history on file.  Social History   Socioeconomic History  . Marital status: Divorced    Spouse name: Not on file  . Number of children: Not on file  . Years of education: Not on file  . Highest education level: Not on file  Occupational History  . Not on file  Tobacco Use  . Smoking status: Never Smoker  . Smokeless tobacco: Never Used  Substance and Sexual Activity  . Alcohol use: Yes  . Drug use: No  . Sexual activity: Not on file  Other Topics Concern  . Not on file  Social History Narrative  . Not on file   Social Determinants of Health   Financial Resource Strain: Not on file  Food Insecurity: Not on file  Transportation Needs: Not on file  Physical Activity: Not on file  Stress: Not on file  Social Connections: Not on file  Intimate Partner Violence: Not on file    Outpatient Medications Prior to Visit  Medication Sig Dispense Refill  . furosemide (LASIX) 20 MG tablet TK 1 T PO D    .  Ibuprofen-diphenhydrAMINE Cit 200-38 MG TABS Take 1 tablet by mouth at bedtime.    Marland Kitchen linaclotide (LINZESS) 145 MCG CAPS capsule Take 1 capsule (145 mcg total) by mouth daily before breakfast. 90 capsule 3  . temazepam (RESTORIL) 15 MG capsule Take 1-2 capsules (15-30 mg total) by mouth at bedtime as needed for sleep. 45 capsule 3  . valACYclovir (VALTREX) 1000 MG tablet TAKE 1 TABLET BY MOUTH DAILY 30 tablet 3   No facility-administered medications prior to visit.    Allergies  Allergen Reactions  . Demerol Nausea And Vomiting and Swelling  . Mobic [Meloxicam] Palpitations    Caused my heart to race    Review of Systems     Objective:    Physical  Exam Constitutional:      Appearance: She is well-developed.  HENT:     Head: Normocephalic and atraumatic.     Right Ear: External ear normal.     Left Ear: External ear normal.     Ears:     Comments: She has significant scar tissue on both tympanic membranes.  No erythema but she does have some fluid particular behind the right TM.    Nose: Nose normal.  Eyes:     Conjunctiva/sclera: Conjunctivae normal.     Pupils: Pupils are equal, round, and reactive to light.  Neck:     Thyroid: No thyromegaly.  Cardiovascular:     Rate and Rhythm: Normal rate and regular rhythm.     Heart sounds: Normal heart sounds.  Pulmonary:     Effort: Pulmonary effort is normal.     Breath sounds: Normal breath sounds. No wheezing.  Musculoskeletal:     Cervical back: Neck supple.  Lymphadenopathy:     Cervical: No cervical adenopathy.  Skin:    General: Skin is warm and dry.  Neurological:     Mental Status: She is alert and oriented to person, place, and time.  Psychiatric:        Behavior: Behavior normal.     BP 130/73   Pulse 75   Temp 98.4 F (36.9 C)   Ht 5\' 5"  (1.651 m)   Wt 142 lb (64.4 kg)   SpO2 99%   BMI 23.63 kg/m  Wt Readings from Last 3 Encounters:  10/02/20 142 lb (64.4 kg)  02/15/20 139 lb (63 kg)  08/31/18 135 lb (61.2 kg)    Health Maintenance Due  Topic Date Due  . HIV Screening  Never done  . Hepatitis C Screening  Never done  . COLONOSCOPY (Pts 45-18yrs Insurance coverage will need to be confirmed)  Never done  . PAP SMEAR-Modifier  01/23/2019  . COVID-19 Vaccine (3 - Booster for Pfizer series) 02/15/2020    There are no preventive care reminders to display for this patient.   Lab Results  Component Value Date   TSH 1.44 07/22/2018   Lab Results  Component Value Date   WBC 7.6 07/22/2018   HGB 14.1 07/22/2018   HCT 42.2 07/22/2018   MCV 89.4 07/22/2018   PLT 437 (H) 07/22/2018   Lab Results  Component Value Date   NA 138 07/22/2018   K 4.1  07/22/2018   CO2 28 07/22/2018   GLUCOSE 88 07/22/2018   BUN 9 07/22/2018   CREATININE 0.64 07/22/2018   BILITOT 0.4 07/22/2018   ALKPHOS 52 04/17/2016   AST 18 07/22/2018   ALT 8 07/22/2018   PROT 7.1 07/22/2018   ALBUMIN 3.6 04/17/2016   CALCIUM 9.4 07/22/2018  ANIONGAP 8 04/30/2016   No results found for: CHOL No results found for: HDL No results found for: LDLCALC No results found for: TRIG No results found for: CHOLHDL Lab Results  Component Value Date   HGBA1C 5.1 07/22/2018       Assessment & Plan:   Problem List Items Addressed This Visit      Other   Primary insomnia    Place referral for sleep medicine specialist to really help [place] figure out what would work well for her.  Her biggest issue is that her mind just races at night she has a hard time shutting it off.  And she feels like the temazepam is no longer working well.  She had tried Ambien previously but really just wanted to get off of the medication.      Relevant Orders   Ambulatory referral to Pulmonology    Other Visit Diagnoses    Sinobronchitis    -  Primary   Relevant Medications   azithromycin (ZITHROMAX) 250 MG tablet   predniSONE (DELTASONE) 20 MG tablet   fluconazole (DIFLUCAN) 150 MG tablet   Dysfunction of both eustachian tubes       Relevant Medications   predniSONE (DELTASONE) 20 MG tablet     Sinobronchitis-discussed options.  We will continue with amoxicillin and add azithromycin for more broad-spectrum coverage.  Also give her prednisone for the eustachian tube dysfunction as she is having some fluid effusion and has only gotten worse since she flew.  Meds ordered this encounter  Medications  . azithromycin (ZITHROMAX) 250 MG tablet    Sig: 2 Ttabs PO on Day 1, then one a day x 4 days.    Dispense:  6 tablet    Refill:  0  . predniSONE (DELTASONE) 20 MG tablet    Sig: Take 2 tablets (40 mg total) by mouth daily with breakfast.    Dispense:  10 tablet    Refill:  0  .  fluconazole (DIFLUCAN) 150 MG tablet    Sig: Take 1 tablet (150 mg total) by mouth daily.    Dispense:  2 tablet    Refill:  0     [name], MD

## 2020-10-09 ENCOUNTER — Telehealth: Payer: Self-pay | Admitting: *Deleted

## 2020-10-09 DIAGNOSIS — H6983 Other specified disorders of Eustachian tube, bilateral: Secondary | ICD-10-CM

## 2020-10-09 DIAGNOSIS — J329 Chronic sinusitis, unspecified: Secondary | ICD-10-CM

## 2020-10-09 NOTE — Telephone Encounter (Signed)
Pt lvm stating that she has finished her ABX and previous prednisone and her sxs haven't gotten any better.    She asked if she could get more prednisone sent in for this.

## 2020-10-10 MED ORDER — PREDNISONE 20 MG PO TABS: 40.0000 mg | ORAL_TABLET | Freq: Every day | ORAL | 0 refills | Status: DC

## 2020-10-10 NOTE — Telephone Encounter (Signed)
Left message advising of recommendations.  

## 2020-10-10 NOTE — Telephone Encounter (Signed)
Refilled prednisone.  But if she is not feeling some better by the end of the week then she may need to follow back up so that we can figure out why she is not getting better.

## 2020-11-01 ENCOUNTER — Encounter: Payer: Self-pay | Admitting: Family Medicine

## 2020-11-29 DIAGNOSIS — R0602 Shortness of breath: Secondary | ICD-10-CM | POA: Diagnosis not present

## 2020-11-29 DIAGNOSIS — R55 Syncope and collapse: Secondary | ICD-10-CM | POA: Diagnosis not present

## 2020-11-29 DIAGNOSIS — R002 Palpitations: Secondary | ICD-10-CM | POA: Diagnosis not present

## 2020-11-29 DIAGNOSIS — R42 Dizziness and giddiness: Secondary | ICD-10-CM | POA: Diagnosis not present

## 2020-12-05 ENCOUNTER — Encounter: Payer: Self-pay | Admitting: Pulmonary Disease

## 2020-12-05 ENCOUNTER — Ambulatory Visit (INDEPENDENT_AMBULATORY_CARE_PROVIDER_SITE_OTHER): Payer: BC Managed Care – PPO | Admitting: Pulmonary Disease

## 2020-12-05 ENCOUNTER — Other Ambulatory Visit: Payer: Self-pay

## 2020-12-05 VITALS — BP 116/74 | HR 65 | Temp 97.6°F | Ht 64.0 in | Wt 141.6 lb

## 2020-12-05 DIAGNOSIS — F5101 Primary insomnia: Secondary | ICD-10-CM | POA: Diagnosis not present

## 2020-12-05 DIAGNOSIS — R0683 Snoring: Secondary | ICD-10-CM | POA: Diagnosis not present

## 2020-12-05 DIAGNOSIS — D649 Anemia, unspecified: Secondary | ICD-10-CM | POA: Diagnosis not present

## 2020-12-05 DIAGNOSIS — G2581 Restless legs syndrome: Secondary | ICD-10-CM

## 2020-12-05 DIAGNOSIS — F458 Other somatoform disorders: Secondary | ICD-10-CM

## 2020-12-05 DIAGNOSIS — G4751 Confusional arousals: Secondary | ICD-10-CM

## 2020-12-05 NOTE — Patient Instructions (Signed)
Lab tests today Try to avoid bright light sources close to bedtime, and get natural light exposure in the morning Keep a set sleep time and wake time Avoid laying in bed if you are having trouble falling to sleep, and only go back to bed when your feel sleepy Try deep breathing exercises when you are having trouble falling back to sleep Okay to continue using restoril at night for now Will arrange for home sleep study Will call to arrange for follow up after sleep study reviewed

## 2020-12-05 NOTE — Progress Notes (Signed)
[facility]  Chief Complaint  Patient presents with   Consult    Patient reports that she has trouble falling asleep, staying asleep, and feels tired all the time.     Constitutional:  BP 116/74 (BP Location: Left Arm, Patient Position: Sitting, Cuff Size: Normal)   Pulse 65   Temp 97.6 F (36.4 C) (Oral)   Ht 5\' 4"  (1.626 m)   Wt 141 lb 9.6 oz (64.2 kg)   SpO2 100%   BMI 24.31 kg/m   Past Medical History:  Asthma, Atrial tachycardia, Ovarian cyst  Past Surgical History:  She  has a past surgical history that includes ablasion; Tubal ligation; Cardiac electrophysiology study and ablation; Nasal septum surgery; Anterior cruciate ligament repair (Left); Tonsillectomy and adenoidectomy; Myringotomy; Breast surgery; Wisdom tooth extraction; Laparoscopic vaginal hysterectomy with salpingectomy (Bilateral, 04/29/2016); Anterior and posterior repair (N/A, 04/29/2016); Bladder suspension (04/29/2016); Cystoscopy (N/A, 04/29/2016); and Total laparoscopic hysterectomy with salpingectomy (Bilateral, 04/29/2016).  Brief Summary:  [name] is a 50 y.o. female with insomnia.      Subjective:   Her sleep issues have been present since childhood.  She has always been a terrible sleeper.  She thinks she inherited this from her father.  She has trouble falling asleep and staying asleep.  She snores sometimes and wakes up during dreams.  She gets teeth grinding and clenching and has a mouth guard.    She tries to go to bed at 10 pm.  She will have lots of random things on her mind before going to sleep.  She will watch a boring TV show in bed with dim lights on.  She hears songs in her head when she wakes up and tries to focus on the noise from her fan to clear her head.  She has been using restoril at night.  This helps her fall asleep within an hour, but doesn't keep her asleep.  She will eat or drink at night, but not be aware of this until the  next morning.  She uses the bathroom several times at night after she wakes up.  If she doesn't use restoril, then it can take hours for her to fall asleep.  She gets up at 6 am.  She feels exhausted throughout the day.  She isn't using anything to help keep her awake.  She doesn't feel like she is depressed.  She gets intermittent leg symptoms.  She was seen previously by neurology and tried on medication for restless leg syndrome.  This didn't help and she was   She denies sleep walking, sleep talking, or nightmares.  There is no history of restless legs.  She denies sleep hallucinations, sleep paralysis, or cataplexy.  The Epworth score is 1 out of 24.   Physical Exam:   Appearance - well kempt   ENMT - no sinus tenderness, no oral exudate, no LAN, Mallampati 2 airway, no stridor, scalloped tongue, decreased AP diameter  Respiratory - equal breath sounds bilaterally, no wheezing or rales  CV - s1s2 regular rate and rhythm, no murmurs  Ext - no clubbing, no edema  Skin - no rashes  Psych - normal mood and affect   Sleep Tests:    Social History:  She  reports that she has never smoked. She has never used smokeless tobacco. She reports current alcohol use. She reports that she does not use drugs.  Family History:  Her family history includes Insomnia in her father.  Discussion:  She has persistent symptoms of sleep onset and sleep maintenance insomnia.  She might also have other sleep conditions contributing to her sleep issues: bruxism, sleep apnea, and restless leg syndrome.  She has experienced confusional arousals while on sleep aide medication.  Assessment/Plan:   Insomnia. - discussed stimulus control, sleep restriction, and relaxation techniques - discussed proper sleep hygiene - continue restoril for now; going ahead might need to look into alternative options for sleep aide medications - discussed setting realistic expectations for her sleep  Snoring. -  associated with sleep disruption and daytime sleepiness - will arrange for home sleep study to further assess whether she could have sleep apnea contributing to her sleep issues  Restless leg syndrome. - will check CMET, CBC, ferritin, B12, folate level  Confusional arousals. - likely related to arousals from sleep while on sleep aide medication - safe sleep environment - assess further after review of her sleep study  Bruxism. - she has dental appliance - discussed how sleep apnea can mimic symptoms of bruxism  Time Spent Involved in Patient Care on Day of Examination:  47 minutes  Follow up:   Patient Instructions  Lab tests today Try to avoid bright light sources close to bedtime, and get natural light exposure in the morning Keep a set sleep time and wake time Avoid laying in bed if you are having trouble falling to sleep, and only go back to bed when your feel sleepy Try deep breathing exercises when you are having trouble falling back to sleep Okay to continue using restoril at night for now Will arrange for home sleep study Will call to arrange for follow up after sleep study reviewed  Medication List:   Allergies as of 12/05/2020       Reactions   Demerol Nausea And Vomiting, Swelling   Mobic [meloxicam] Palpitations   Caused my heart to race        Medication List        Accurate as of December 05, 2020  4:43 PM. If you have any questions, ask your nurse or doctor.          STOP taking these medications    predniSONE 20 MG tablet Commonly known as: DELTASONE Stopped by: Chesley Mires, MD       TAKE these medications    furosemide 20 MG tablet Commonly known as: LASIX TK 1 T PO D   Ibuprofen-diphenhydrAMINE Cit 200-38 MG Tabs Take 1 tablet by mouth at bedtime.   linaclotide 145 MCG Caps capsule Commonly known as: Linzess Take 1 capsule (145 mcg total) by mouth daily before breakfast.   temazepam 15 MG capsule Commonly known as:  RESTORIL Take 1-2 capsules (15-30 mg total) by mouth at bedtime as needed for sleep.   valACYclovir 1000 MG tablet Commonly known as: VALTREX TAKE 1 TABLET BY MOUTH DAILY        Signature:  Chesley Mires, MD [place] Pager - [phone] 12/05/2020, 4:43 PM

## 2020-12-06 LAB — CBC WITH DIFFERENTIAL/PLATELET
Basophils Absolute: 0.1 10*3/uL (ref 0.0–0.1)
Basophils Relative: 1.2 % (ref 0.0–3.0)
Eosinophils Absolute: 0.2 10*3/uL (ref 0.0–0.7)
Eosinophils Relative: 2 % (ref 0.0–5.0)
HCT: 38.9 % (ref 36.0–46.0)
Hemoglobin: 13.5 g/dL (ref 12.0–15.0)
Lymphocytes Relative: 25.8 % (ref 12.0–46.0)
Lymphs Abs: 2 10*3/uL (ref 0.7–4.0)
MCHC: 34.7 g/dL (ref 30.0–36.0)
MCV: 90.7 fl (ref 78.0–100.0)
Monocytes Absolute: 0.4 10*3/uL (ref 0.1–1.0)
Monocytes Relative: 5.3 % (ref 3.0–12.0)
Neutro Abs: 5.1 10*3/uL (ref 1.4–7.7)
Neutrophils Relative %: 65.7 % (ref 43.0–77.0)
Platelets: 278 10*3/uL (ref 150.0–400.0)
RBC: 4.28 Mil/uL (ref 3.87–5.11)
RDW: 13.4 % (ref 11.5–15.5)
WBC: 7.7 10*3/uL (ref 4.0–10.5)

## 2020-12-06 LAB — FOLATE: Folate: >24.4 ng/mL (ref >5.9)

## 2020-12-06 LAB — COMPREHENSIVE METABOLIC PANEL
ALT: 8 U/L (ref 0–35)
AST: 17 U/L (ref 0–37)
Albumin: 4.2 g/dL (ref 3.5–5.2)
Alkaline Phosphatase: 45 U/L (ref 39–117)
BUN: 15 mg/dL (ref 6–23)
CO2: 24 mEq/L (ref 19–32)
Calcium: 8.6 mg/dL (ref 8.4–10.5)
Chloride: 104 mEq/L (ref 96–112)
Creatinine, Ser: 0.71 mg/dL (ref 0.40–1.20)
GFR: 100.24 mL/min (ref >60.00)
Glucose, Bld: 84 mg/dL (ref 70–99)
Potassium: 3.9 mEq/L (ref 3.5–5.1)
Sodium: 136 mEq/L (ref 135–145)
Total Bilirubin: 0.3 mg/dL (ref 0.2–1.2)
Total Protein: 6.9 g/dL (ref 6.0–8.3)

## 2020-12-06 LAB — VITAMIN B12: Vitamin B-12: 320 pg/mL (ref 211–911)

## 2020-12-06 LAB — IRON AND TIBC
Iron Saturation: 19 % (ref 15–55)
Iron: 59 ug/dL (ref 27–159)
Total Iron Binding Capacity: 309 ug/dL (ref 250–450)
UIBC: 250 ug/dL (ref 131–425)

## 2020-12-06 LAB — FERRITIN: Ferritin: 53.5 ng/mL (ref 10.0–291.0)

## 2020-12-07 ENCOUNTER — Encounter: Payer: Self-pay | Admitting: Family Medicine

## 2020-12-07 ENCOUNTER — Encounter: Payer: Self-pay | Admitting: *Deleted

## 2020-12-07 DIAGNOSIS — R55 Syncope and collapse: Secondary | ICD-10-CM | POA: Diagnosis not present

## 2020-12-27 ENCOUNTER — Encounter: Payer: Self-pay | Admitting: Family Medicine

## 2021-01-02 DIAGNOSIS — Z03818 Encounter for observation for suspected exposure to other biological agents ruled out: Secondary | ICD-10-CM | POA: Diagnosis not present

## 2021-01-02 DIAGNOSIS — J019 Acute sinusitis, unspecified: Secondary | ICD-10-CM | POA: Diagnosis not present

## 2021-01-16 ENCOUNTER — Encounter: Payer: Self-pay | Admitting: Family Medicine

## 2021-01-17 NOTE — Telephone Encounter (Signed)
Will need appointment.  This is a scheduled for drug.  Happy to discuss options with her.  There are 2 in this class on the market.  Okay for virtual appointment as this will most really be just discussion.

## 2021-02-05 ENCOUNTER — Encounter: Payer: Self-pay | Admitting: Family Medicine

## 2021-02-05 ENCOUNTER — Telehealth (INDEPENDENT_AMBULATORY_CARE_PROVIDER_SITE_OTHER): Payer: BC Managed Care – PPO | Admitting: Family Medicine

## 2021-02-05 VITALS — Ht 64.0 in | Wt 135.0 lb

## 2021-02-05 DIAGNOSIS — F5101 Primary insomnia: Secondary | ICD-10-CM | POA: Diagnosis not present

## 2021-02-05 DIAGNOSIS — K2100 Gastro-esophageal reflux disease with esophagitis, without bleeding: Secondary | ICD-10-CM

## 2021-02-05 DIAGNOSIS — Z1211 Encounter for screening for malignant neoplasm of colon: Secondary | ICD-10-CM

## 2021-02-05 MED ORDER — QUVIVIQ 25 MG PO TABS: 25.0000 mg | ORAL_TABLET | Freq: Every day | ORAL | 0 refills | Status: DC

## 2021-02-05 NOTE — Assessment & Plan Note (Signed)
Discussed options and new class of medications.  We did discuss that she will need a washout period for the temazepam and then she can go ahead and start the Quiviviq.  Discussed potential side effects to monitor for.  And how the medication works.  We will start with 25 mg daily for 1 month and if she tolerates well if needed we can go to 50 mg.  Did warn that it is a scheduled drug.  Follow-up in a couple of months to let me know how she is doing.

## 2021-02-05 NOTE — Assessment & Plan Note (Signed)
Placed referral to GI to see if they would consider doing an upper endoscopy at the same time that they do her colonoscopy.

## 2021-02-05 NOTE — Progress Notes (Signed)
Virtual Visit via Video Note  I connected with [name] on 02/05/21 at  2:40 PM EDT by a video enabled telemedicine application and verified that I am speaking with the correct person using two identifiers.   I discussed the limitations of evaluation and management by telemedicine and the availability of in person appointments. The patient expressed understanding and agreed to proceed.  Patient location: at home Provider location: in office  Subjective:    CC: sleep concerns   HPI:   Sleep troubles  -she has been on temazepam for years and says for a long time it worked really well would help her fall asleep and stay asleep.  More recently its not been helping her stay asleep she still falls asleep easily and she does like her bedroom is dark quiet and cool and conducive to sleep..  She says what will happen is she will just wake up after a few hours of sleep and then all of a sudden her brain is just very alert she will have song stuck in her head that she cannot quite get rid of it is not that she is worrying but her mind just seems really ramped up.  And then she cannot go back to sleep.  She has done some reading about some of the newer medications and is interested in those  Chronic GERD -she is due for colon cancer screening she has never had screening she is 50 based on the new guidelines she would need a scope.  She does have a history of recurrent GERD she says it runs in her family and her grandfather was actually diagnosed with esophageal cancer.   Past medical history, Surgical history, Family history not pertinant except as noted below, Social history, Allergies, and medications have been entered into the medical record, reviewed, and corrections made.    Objective:    General: Speaking clearly in complete sentences without any shortness of breath.  Alert and oriented x3.  Normal judgment. No apparent acute distress.    Impression and Recommendations:    Primary  insomnia Discussed options and new class of medications.  We did discuss that she will need a washout period for the temazepam and then she can go ahead and start the Quiviviq.  Discussed potential side effects to monitor for.  And how the medication works.  We will start with 25 mg daily for 1 month and if she tolerates well if needed we can go to 50 mg.  Did warn that it is a scheduled drug.  Follow-up in a couple of months to let me know how she is doing.  Gastroesophageal reflux disease with esophagitis without hemorrhage Placed referral to GI to see if they would consider doing an upper endoscopy at the same time that they do her colonoscopy.    Orders Placed This Encounter  Procedures   Ambulatory referral to Gastroenterology    Referral Priority:   Routine    Referral Type:   Consultation    Referral Reason:   Specialty Services Required    Number of Visits Requested:   1    Meds ordered this encounter  Medications   Daridorexant HCl (QUVIVIQ) 25 MG TABS    Sig: Take 25 mg by mouth at bedtime.    Dispense:  30 tablet    Refill:  0    Bring coupon card     I discussed the assessment and treatment plan with the patient. The patient was provided an opportunity to  ask questions and all were answered. The patient agreed with the plan and demonstrated an understanding of the instructions.   The patient was advised to call back or seek an in-person evaluation if the symptoms worsen or if the condition fails to improve as anticipated.   Kristen Lecher, MD

## 2021-02-05 NOTE — Progress Notes (Signed)
[name]

## 2021-02-20 ENCOUNTER — Ambulatory Visit
Admission: RE | Admit: 2021-02-20 | Discharge: 2021-02-20 | Disposition: A | Discharge status: Home / Self Care | Payer: BC Managed Care – PPO | Source: Ambulatory Visit | Attending: Obstetrics and Gynecology | Admitting: Obstetrics and Gynecology

## 2021-02-20 ENCOUNTER — Other Ambulatory Visit: Payer: Self-pay

## 2021-02-20 ENCOUNTER — Other Ambulatory Visit: Payer: Self-pay | Admitting: Obstetrics and Gynecology

## 2021-02-20 ENCOUNTER — Encounter: Payer: Self-pay | Admitting: Family Medicine

## 2021-02-20 DIAGNOSIS — R922 Inconclusive mammogram: Secondary | ICD-10-CM | POA: Diagnosis not present

## 2021-02-20 DIAGNOSIS — F5101 Primary insomnia: Secondary | ICD-10-CM

## 2021-02-20 DIAGNOSIS — N6489 Other specified disorders of breast: Secondary | ICD-10-CM

## 2021-02-21 MED ORDER — BELSOMRA 10 MG PO TABS: 10.0000 mg | ORAL_TABLET | Freq: Every evening | ORAL | 1 refills | Status: DC | PRN

## 2021-03-12 DIAGNOSIS — R002 Palpitations: Secondary | ICD-10-CM | POA: Diagnosis not present

## 2021-03-12 DIAGNOSIS — F1010 Alcohol abuse, uncomplicated: Secondary | ICD-10-CM | POA: Diagnosis not present

## 2021-03-13 DIAGNOSIS — R002 Palpitations: Secondary | ICD-10-CM | POA: Diagnosis not present

## 2021-03-13 MED ORDER — BELSOMRA 20 MG PO TABS: 20.0000 mg | ORAL_TABLET | Freq: Every day | ORAL | 0 refills | Status: DC

## 2021-03-13 NOTE — Addendum Note (Signed)
Addended by: [name] D on: 03/13/2021 01:22 PM   Modules accepted: Orders

## 2021-03-13 NOTE — Telephone Encounter (Signed)
Orders Placed This Encounter  Procedures   Ambulatory referral to Pulmonology    Referral Priority:   Routine    Referral Type:   Consultation    Referral Reason:   Specialty Services Required    Requested Specialty:   Pulmonary Disease    Number of Visits Requested:   1    

## 2021-03-31 DIAGNOSIS — Z4509 Encounter for adjustment and management of other cardiac device: Secondary | ICD-10-CM | POA: Diagnosis not present

## 2021-04-02 DIAGNOSIS — I471 Supraventricular tachycardia: Secondary | ICD-10-CM | POA: Diagnosis not present

## 2021-04-02 DIAGNOSIS — Z4509 Encounter for adjustment and management of other cardiac device: Secondary | ICD-10-CM | POA: Diagnosis not present

## 2021-04-24 DIAGNOSIS — N898 Other specified noninflammatory disorders of vagina: Secondary | ICD-10-CM | POA: Diagnosis not present

## 2021-04-24 DIAGNOSIS — R350 Frequency of micturition: Secondary | ICD-10-CM | POA: Diagnosis not present

## 2021-04-24 DIAGNOSIS — N8189 Other female genital prolapse: Secondary | ICD-10-CM | POA: Diagnosis not present

## 2021-04-24 DIAGNOSIS — Z202 Contact with and (suspected) exposure to infections with a predominantly sexual mode of transmission: Secondary | ICD-10-CM | POA: Diagnosis not present

## 2021-04-24 DIAGNOSIS — Z113 Encounter for screening for infections with a predominantly sexual mode of transmission: Secondary | ICD-10-CM | POA: Diagnosis not present

## 2021-05-01 DIAGNOSIS — Z4509 Encounter for adjustment and management of other cardiac device: Secondary | ICD-10-CM | POA: Diagnosis not present

## 2021-05-03 DIAGNOSIS — Z95818 Presence of other cardiac implants and grafts: Secondary | ICD-10-CM | POA: Diagnosis not present

## 2021-05-03 DIAGNOSIS — R55 Syncope and collapse: Secondary | ICD-10-CM | POA: Diagnosis not present

## 2021-06-01 DIAGNOSIS — Z4509 Encounter for adjustment and management of other cardiac device: Secondary | ICD-10-CM | POA: Diagnosis not present

## 2021-06-05 DIAGNOSIS — R55 Syncope and collapse: Secondary | ICD-10-CM | POA: Diagnosis not present

## 2021-06-05 DIAGNOSIS — Z4509 Encounter for adjustment and management of other cardiac device: Secondary | ICD-10-CM | POA: Diagnosis not present

## 2021-06-15 ENCOUNTER — Other Ambulatory Visit: Payer: Self-pay

## 2021-06-18 ENCOUNTER — Other Ambulatory Visit: Payer: Self-pay

## 2021-06-18 MED ORDER — TEMAZEPAM 15 MG PO CAPS: 15.0000 mg | ORAL_CAPSULE | Freq: Every evening | ORAL | 0 refills | Status: DC | PRN

## 2021-06-18 NOTE — Telephone Encounter (Signed)
Patient called stating the Belsomra is not working and would like to go back on the Temazepam. Forward to Dr. Madilyn Fireman for review.

## 2021-06-18 NOTE — Telephone Encounter (Signed)
Patient advised of message.

## 2021-06-18 NOTE — Telephone Encounter (Signed)
Meds ordered this encounter  Medications   temazepam (RESTORIL) 15 MG capsule    Sig: Take 1 capsule (15 mg total) by mouth at bedtime as needed for sleep.    Dispense:  90 capsule    Refill:  0

## 2021-07-02 DIAGNOSIS — Z4509 Encounter for adjustment and management of other cardiac device: Secondary | ICD-10-CM | POA: Diagnosis not present

## 2021-07-05 DIAGNOSIS — R55 Syncope and collapse: Secondary | ICD-10-CM | POA: Diagnosis not present

## 2021-07-05 DIAGNOSIS — Z4509 Encounter for adjustment and management of other cardiac device: Secondary | ICD-10-CM | POA: Diagnosis not present

## 2021-07-12 DIAGNOSIS — K5900 Constipation, unspecified: Secondary | ICD-10-CM | POA: Diagnosis not present

## 2021-07-12 DIAGNOSIS — Z87448 Personal history of other diseases of urinary system: Secondary | ICD-10-CM | POA: Diagnosis not present

## 2021-07-12 DIAGNOSIS — Z1211 Encounter for screening for malignant neoplasm of colon: Secondary | ICD-10-CM | POA: Diagnosis not present

## 2021-07-12 DIAGNOSIS — K219 Gastro-esophageal reflux disease without esophagitis: Secondary | ICD-10-CM | POA: Diagnosis not present

## 2021-07-12 DIAGNOSIS — N816 Rectocele: Secondary | ICD-10-CM | POA: Diagnosis not present

## 2021-07-12 DIAGNOSIS — N393 Stress incontinence (female) (male): Secondary | ICD-10-CM | POA: Diagnosis not present

## 2021-07-12 DIAGNOSIS — K5901 Slow transit constipation: Secondary | ICD-10-CM | POA: Diagnosis not present

## 2021-07-18 ENCOUNTER — Encounter: Payer: Self-pay | Admitting: Family Medicine

## 2021-07-19 ENCOUNTER — Telehealth: Payer: Self-pay

## 2021-07-19 NOTE — Telephone Encounter (Signed)
Initiated Prior authorization [id number] 145MCG capsules ?Via: Covermymeds ?Case/Key:[id number] ?Status: denied  as of 07/19/21 ?Reason:plan exclusion ?Notified Pt via: Mychart ?

## 2021-07-27 ENCOUNTER — Encounter: Payer: Self-pay | Admitting: Family Medicine

## 2021-07-27 MED ORDER — TEMAZEPAM 22.5 MG PO CAPS: 22.5000 mg | ORAL_CAPSULE | Freq: Every evening | ORAL | 3 refills | Status: DC | PRN

## 2021-08-03 DIAGNOSIS — Z4509 Encounter for adjustment and management of other cardiac device: Secondary | ICD-10-CM | POA: Diagnosis not present

## 2021-08-03 DIAGNOSIS — R55 Syncope and collapse: Secondary | ICD-10-CM | POA: Diagnosis not present

## 2021-08-08 DIAGNOSIS — Z4509 Encounter for adjustment and management of other cardiac device: Secondary | ICD-10-CM | POA: Diagnosis not present

## 2021-08-13 ENCOUNTER — Institutional Professional Consult (permissible substitution): Payer: BC Managed Care – PPO | Admitting: Pulmonary Disease

## 2021-08-17 ENCOUNTER — Encounter: Payer: Self-pay | Admitting: Pulmonary Disease

## 2021-08-17 ENCOUNTER — Ambulatory Visit: Payer: BC Managed Care – PPO | Admitting: Pulmonary Disease

## 2021-08-17 VITALS — BP 110/74 | HR 66 | Temp 97.8°F | Ht 64.0 in | Wt 147.0 lb

## 2021-08-17 DIAGNOSIS — G4709 Other insomnia: Secondary | ICD-10-CM

## 2021-08-17 MED ORDER — TRAZODONE HCL 50 MG PO TABS: 50.0000 mg | ORAL_TABLET | Freq: Every day | ORAL | 3 refills | Status: DC

## 2021-08-17 NOTE — Progress Notes (Signed)
? ?      ?EARLYNE FEESER    [id number]    03/03/1971 ? ?Primary Care Physician:Metheney, Rene Kocher, MD ? ?Referring Physician: Hali Marry, MD ?[address] ?[address] ?[place],  [place] [address] ? ?Chief complaint:   ?Patient with a history of chronic insomnia ? ?HPI: ? ?History of insomnia for many years ? ?Has tried multiple medications in the past that have not helped ? ?Recently on temazepam 22.5 which does help her get to sleep but does not keep her asleep ?Still has daytime fatigue and sleepiness ? ?Recently used a lower dose of temazepam and could not sleep at all ? ?Prescribed [phone] Not Covered and Very Expensive, Belsomra did not work, Ambien did not work ? ?Continues to have difficulty falling asleep, staying asleep ?She does have a history of cardiac dysrhythmia history of head trauma which she feels may have started off the battle with insomnia ? ?Dad also did have issues with insomnia ? ?History of snoring, teeth grinding ?Denies any other symptoms that she feels may be related to sleep apnea ? ?Has tried different behavioral modifications to help insomnia to no avail ?Feels tired through the day, she feels this is just related to not getting enough hours of sleep ? ?No history of sleep paralysis, no sleepwalking, sleep talking, no history suggestive of restless legs ? ?Never smoker ? ?Outpatient Encounter Medications as of 08/17/2021  ?Medication Sig  ? furosemide (LASIX) 20 MG tablet TK 1 T PO D  ? linaclotide (LINZESS) 145 MCG CAPS capsule Take 1 capsule (145 mcg total) by mouth daily before breakfast.  ? temazepam (RESTORIL) 22.5 MG capsule Take 1 capsule (22.5 mg total) by mouth at bedtime as needed for sleep.  ? valACYclovir (VALTREX) 1000 MG tablet TAKE 1 TABLET BY MOUTH DAILY  ? ?No facility-administered encounter medications on file as of 08/17/2021.  ? ? ?Allergies as of 08/17/2021 - Review Complete 08/17/2021  ?Allergen Reaction Noted  ? Demerol Nausea And Vomiting  and Swelling 06/28/2011  ? Mobic [meloxicam] Palpitations 02/15/2020  ? ? ?Past Medical History:  ?Diagnosis Date  ? Asthma   ? allergy, exercise induced  ? Atrial tachycardia ([facility])   ? Decreased cortisol level ([facility])   ? H/O total hysterectomy with bilateral salpingo-oophorectomy (BSO) 04/29/2016  ? Lethargic   ? Ovarian cyst   ? PONV (postoperative nausea and vomiting)   ? ? ?Past Surgical History:  ?Procedure Laterality Date  ? ablasion    ? ANTERIOR AND POSTERIOR REPAIR N/A 04/29/2016  ? Procedure: ANTERIOR (CYSTOCELE) AND POSTERIOR REPAIR (RECTOCELE);  Surgeon: Janyth Contes, MD;  Location: [facility];  Service: Gynecology;  Laterality: N/A;  ? ANTERIOR CRUCIATE LIGAMENT REPAIR Left   ? BLADDER SUSPENSION  04/29/2016  ? Procedure: Attempted TRANSVAGINAL TAPE (TVT) PROCEDURE, Cystotomy;  Surgeon: Cheri Fowler, MD;  Location: [facility];  Service: Gynecology;;  ? BREAST SURGERY    ? augmentation  ? CARDIAC ELECTROPHYSIOLOGY STUDY AND ABLATION    ? twice  ? CYSTOSCOPY N/A 04/29/2016  ? Procedure: CYSTOSCOPY;  Surgeon: Cheri Fowler, MD;  Location: [facility];  Service: Gynecology;  Laterality: N/A;  ? LAPAROSCOPIC VAGINAL HYSTERECTOMY WITH SALPINGECTOMY Bilateral 04/29/2016  ? Procedure: LAPAROSCOPIC ASSISTED VAGINAL HYSTERECTOMY WITH Bilateral  SALPINGECTOMY;  Surgeon: Janyth Contes, MD;  Location: [facility];  Service: Gynecology;  Laterality: Bilateral;  ? MYRINGOTOMY    ? NASAL SEPTUM SURGERY    ? TONSILLECTOMY AND ADENOIDECTOMY    ? TOTAL LAPAROSCOPIC HYSTERECTOMY WITH  SALPINGECTOMY Bilateral 04/29/2016  ? TUBAL LIGATION    ? WISDOM TOOTH EXTRACTION    ? ? ?Family History  ?Problem Relation Age of Onset  ? Insomnia Father   ? ? ?Social History  ? ?Socioeconomic History  ? Marital status: Divorced  ?  Spouse name: Not on file  ? Number of children: Not on file  ? Years of education: Not on file  ? Highest education level: Not on file  ?Occupational History  ? Not on file  ?Tobacco Use  ? Smoking status: Never   ? Smokeless tobacco: Never  ?Substance and Sexual Activity  ? Alcohol use: Yes  ? Drug use: No  ? Sexual activity: Not on file  ?Other Topics Concern  ? Not on file  ?Social History Narrative  ? Not on file  ? ?Social Determinants of Health  ? ?Financial Resource Strain: Not on file  ?Food Insecurity: Not on file  ?Transportation Needs: Not on file  ?Physical Activity: Not on file  ?Stress: Not on file  ?Social Connections: Not on file  ?Intimate Partner Violence: Not on file  ? ? ?Review of Systems  ?Constitutional:  Positive for fatigue.  ?Psychiatric/Behavioral:  Positive for sleep disturbance.   ? ?Vitals:  ? 08/17/21 0946  ?BP: 110/74  ?Pulse: 66  ?Temp: 97.8 ?F (36.6 ?C)  ?SpO2: 99%  ? ? ? ?Physical Exam ?Constitutional:   ?   Appearance: Normal appearance.  ?HENT:  ?   Head: Normocephalic.  ?   Mouth/Throat:  ?   Mouth: Mucous membranes are moist.  ?Cardiovascular:  ?   Rate and Rhythm: Normal rate and regular rhythm.  ?   Heart sounds: No murmur heard. ?  No friction rub.  ?Pulmonary:  ?   Effort: No respiratory distress.  ?   Breath sounds: No stridor. No wheezing or rhonchi.  ?Musculoskeletal:  ?   Cervical back: No rigidity or tenderness.  ?Neurological:  ?   Mental Status: She is alert.  ?Psychiatric:     ?   Mood and Affect: Mood normal.  ? ? ?Data Reviewed: ?Dr. Juanetta Gosling notes reviewed ? ?Ferritin and blood work was within normal limits ? ?Assessment:  ?Insomnia ?-Behavioral changes have not really helped ?-The only medication that she has been able to take that has helped is restarted at 22.5 mg ? ?She had tried diazepam in the past which did help her get more sleep and felt better rested ?-Discussed low-dose diazepam as a trial if addition of trazodone does not help ? ? ?Plan/Recommendations: ?Referral for cognitive behavioral therapy ? ?Continue with restoril ? ?Prescription for trazodone 50 mg nightly ? ?I believe cognitive behavioral therapy will help ? ?She will continue to use an oral appliance  for bruxism ? ?I will see her back in about 6 weeks ? ?Encouraged to call with any significant concerns ? ?She does have significant enough symptoms and significant enough fatigue during the day and does need optimization of current treatment to achieve better sleep to be able to function ?Risk with medications for sleep discussed ? ?Sherrilyn Rist MD ?[facility] ?08/17/2021, 10:29 AM ? ?CC: Hali Marry, * ? ? ?

## 2021-08-17 NOTE — Patient Instructions (Signed)
Referral for cognitive behavioral therapy for insomnia ? ?Prescription for trazodone ?If trazodone does not help, we can try a low-dose of diazepam ? ?Continue Restoril 22.5 ? ?I will see you back in about 6 weeks ? ?Call us with any significant concerns ?

## 2021-08-22 ENCOUNTER — Ambulatory Visit
Admission: RE | Admit: 2021-08-22 | Discharge: 2021-08-22 | Disposition: A | Discharge status: Home / Self Care | Payer: BC Managed Care – PPO | Source: Ambulatory Visit | Attending: Obstetrics and Gynecology | Admitting: Obstetrics and Gynecology

## 2021-08-22 DIAGNOSIS — N6489 Other specified disorders of breast: Secondary | ICD-10-CM

## 2021-08-22 DIAGNOSIS — R922 Inconclusive mammogram: Secondary | ICD-10-CM | POA: Diagnosis not present

## 2021-09-06 DIAGNOSIS — Z4509 Encounter for adjustment and management of other cardiac device: Secondary | ICD-10-CM | POA: Diagnosis not present

## 2021-09-06 DIAGNOSIS — R55 Syncope and collapse: Secondary | ICD-10-CM | POA: Diagnosis not present

## 2021-09-07 DIAGNOSIS — Z4509 Encounter for adjustment and management of other cardiac device: Secondary | ICD-10-CM | POA: Diagnosis not present

## 2021-09-24 DIAGNOSIS — F4323 Adjustment disorder with mixed anxiety and depressed mood: Secondary | ICD-10-CM | POA: Diagnosis not present

## 2021-09-25 DIAGNOSIS — F4323 Adjustment disorder with mixed anxiety and depressed mood: Secondary | ICD-10-CM | POA: Diagnosis not present

## 2021-10-02 ENCOUNTER — Ambulatory Visit: Payer: BC Managed Care – PPO | Admitting: Family Medicine

## 2021-10-02 DIAGNOSIS — K219 Gastro-esophageal reflux disease without esophagitis: Secondary | ICD-10-CM | POA: Diagnosis not present

## 2021-10-02 DIAGNOSIS — K449 Diaphragmatic hernia without obstruction or gangrene: Secondary | ICD-10-CM | POA: Diagnosis not present

## 2021-10-02 DIAGNOSIS — Z1211 Encounter for screening for malignant neoplasm of colon: Secondary | ICD-10-CM | POA: Diagnosis not present

## 2021-10-02 LAB — HM COLONOSCOPY

## 2021-10-05 ENCOUNTER — Encounter: Payer: Self-pay | Admitting: Family Medicine

## 2021-10-05 ENCOUNTER — Ambulatory Visit (INDEPENDENT_AMBULATORY_CARE_PROVIDER_SITE_OTHER): Payer: BC Managed Care – PPO | Admitting: Family Medicine

## 2021-10-05 ENCOUNTER — Ambulatory Visit (INDEPENDENT_AMBULATORY_CARE_PROVIDER_SITE_OTHER): Payer: BC Managed Care – PPO

## 2021-10-05 VITALS — BP 121/71 | HR 63 | Resp 16 | Ht 64.0 in | Wt 147.0 lb

## 2021-10-05 DIAGNOSIS — R0602 Shortness of breath: Secondary | ICD-10-CM

## 2021-10-05 DIAGNOSIS — R5383 Other fatigue: Secondary | ICD-10-CM

## 2021-10-05 DIAGNOSIS — K2100 Gastro-esophageal reflux disease with esophagitis, without bleeding: Secondary | ICD-10-CM | POA: Diagnosis not present

## 2021-10-05 MED ORDER — VALACYCLOVIR HCL 1 G PO TABS: 1000.0000 mg | ORAL_TABLET | Freq: Every day | ORAL | 3 refills | Status: DC

## 2021-10-05 MED ORDER — PANTOPRAZOLE SODIUM 40 MG PO TBEC: 40.0000 mg | DELAYED_RELEASE_TABLET | Freq: Every day | ORAL | 1 refills | Status: DC

## 2021-10-05 NOTE — Progress Notes (Signed)
Established Patient Office Visit  Subjective   Patient ID: [name], female    DOB: [birth year]  Age: 51 y.o. MRN: [id number]  Chief Complaint  Patient presents with   Shortness of Breath    5 months.    Anxiety    1 month since the passing of her father.    Colonoscopy    Done 10/04/21. Release form faxed to [facility].     HPI She reports episodes of feeling short of breath on and off since January.  She says it feels like sometimes she is breathing through a wet sponge.  It always seems to be worse after she has been active and then tries to sit down and rest.  She is also had a clear runny nose and watery eyes on and off as well.  She is just been waking up feeling exhausted and tired.  She did have COVID last summer and wonders if that could be contributing.  She feels like she has been retaining fluid and feeling a little bit more swollen than usual.  No cough.  Having issues with dizziness and her heart feels like it is skipping, feels a thumping sensation. Has a hard time getting a deep breath.  He does have a loop recorder in it and has been in contact with cardiology.  She also reports more heartburn and reflux over the last month.  In fact she just had an endoscopy and colonoscopy with Dr. Andy Gauss earlier this week.  They put her on a PPI as she did have some evidence of esophagitis.  Also, her father passed away about a month ago from heart failure so she is also worried about that.  Last labs in July 2022.    ROS    Objective:     BP 121/71   Pulse 63   Resp 16   Ht '5\' 4"'$  (1.626 m)   Wt 147 lb (66.7 kg)   LMP 04/26/2016 (Exact Date)   SpO2 100%   BMI 25.23 kg/m    Physical Exam Constitutional:      Appearance: She is well-developed.  HENT:     Head: Normocephalic and atraumatic.     Right Ear: External ear normal.     Left Ear: External ear normal.     Nose: Nose normal.  Eyes:     Conjunctiva/sclera: Conjunctivae normal.     Pupils:  Pupils are equal, round, and reactive to light.  Neck:     Thyroid: No thyromegaly.  Cardiovascular:     Rate and Rhythm: Normal rate and regular rhythm.     Heart sounds: Normal heart sounds.  Pulmonary:     Effort: Pulmonary effort is normal.     Breath sounds: Normal breath sounds. No wheezing.  Musculoskeletal:     Cervical back: Neck supple.  Lymphadenopathy:     Cervical: No cervical adenopathy.  Skin:    General: Skin is warm and dry.  Neurological:     Mental Status: She is alert and oriented to person, place, and time.     No results found for any visits on 10/05/21.  Last thyroid functions Lab Results  Component Value Date   TSH 1.44 07/22/2018      The ASCVD Risk score (Arnett DK, et al., 2019) failed to calculate for the following reasons:   Cannot find a previous HDL lab   Cannot find a previous total cholesterol lab    Assessment & Plan:   Problem List  Items Addressed This Visit       Digestive   Gastroesophageal reflux disease with esophagitis without hemorrhage   Relevant Medications   pantoprazole (PROTONIX) 40 MG tablet   Other Visit Diagnoses     Other fatigue    -  Primary   Relevant Medications   valACYclovir (VALTREX) 1000 MG tablet   Other Relevant Orders   COMPLETE METABOLIC PANEL WITH GFR   TSH   CBC   B Nat Peptide   DG Chest 2 View   SOB (shortness of breath)       Relevant Medications   valACYclovir (VALTREX) 1000 MG tablet   Other Relevant Orders   COMPLETE METABOLIC PANEL WITH GFR   TSH   CBC   B Nat Peptide   DG Chest 2 View       SOB x 5 months.  Discussed further work-up including a chest x-ray and labs.  I do not suspect heart failure but we could certainly get a BNP.  I think that would help reassure her.  Symptoms started before her father passed away.  Though I do think that they may have increased her stress levels.  Also consider that GERD could be contributing to some of her symptoms.  She was to started on a  PPI for esophagitis it will be interesting to see if her symptoms improve over the next 2 to 3 weeks. We will check for anemia and thyroid disorder.  Consider spirometry if the chest x-ray is normal.  Palpitations-she will follow-up with cardiology for that.  She has been in touch with them and she currently has a loop recorder in place.  Return in about 1 month (around 11/05/2021).    [name], MD

## 2021-10-06 LAB — COMPLETE METABOLIC PANEL WITH GFR
AG Ratio: 1.5 (calc) (ref 1.0–2.5)
ALT: 9 U/L (ref 6–29)
AST: 20 U/L (ref 10–35)
Albumin: 4.1 g/dL (ref 3.6–5.1)
Alkaline phosphatase (APISO): 46 U/L (ref 31–125)
BUN: 18 mg/dL (ref 7–25)
CO2: 26 mmol/L (ref 20–32)
Calcium: 9.3 mg/dL (ref 8.6–10.2)
Chloride: 103 mmol/L (ref 98–110)
Creat: 0.77 mg/dL (ref 0.50–0.99)
Globulin: 2.7 g/dL (calc) (ref 1.9–3.7)
Glucose, Bld: 73 mg/dL (ref 65–99)
Potassium: 4.7 mmol/L (ref 3.5–5.3)
Sodium: 137 mmol/L (ref 135–146)
Total Bilirubin: 0.4 mg/dL (ref 0.2–1.2)
Total Protein: 6.8 g/dL (ref 6.1–8.1)
eGFR: 95 mL/min/{1.73_m2} (ref >=60)

## 2021-10-06 LAB — CBC
HCT: 41.7 % (ref 35.0–45.0)
Hemoglobin: 14.1 g/dL (ref 11.7–15.5)
MCH: 31.3 pg (ref 27.0–33.0)
MCHC: 33.8 g/dL (ref 32.0–36.0)
MCV: 92.7 fL (ref 80.0–100.0)
MPV: 10.9 fL (ref 7.5–12.5)
Platelets: 358 10*3/uL (ref 140–400)
RBC: 4.5 10*6/uL (ref 3.80–5.10)
RDW: 12.4 % (ref 11.0–15.0)
WBC: 6.6 10*3/uL (ref 3.8–10.8)

## 2021-10-06 LAB — TSH: TSH: 0.73 mIU/L

## 2021-10-06 LAB — BRAIN NATRIURETIC PEPTIDE: Brain Natriuretic Peptide: 15 pg/mL (ref <100)

## 2021-10-08 DIAGNOSIS — I493 Ventricular premature depolarization: Secondary | ICD-10-CM | POA: Diagnosis not present

## 2021-10-08 DIAGNOSIS — R42 Dizziness and giddiness: Secondary | ICD-10-CM | POA: Diagnosis not present

## 2021-10-08 DIAGNOSIS — R0602 Shortness of breath: Secondary | ICD-10-CM | POA: Diagnosis not present

## 2021-10-08 DIAGNOSIS — Z4509 Encounter for adjustment and management of other cardiac device: Secondary | ICD-10-CM | POA: Diagnosis not present

## 2021-10-08 DIAGNOSIS — R55 Syncope and collapse: Secondary | ICD-10-CM | POA: Diagnosis not present

## 2021-10-08 NOTE — Progress Notes (Signed)
Hi [name], your thyroid and metabolic panel look great.  Your blood count is normal no sign of anemia or infection.  No sign of heart failure.  Neck step would be schedule you for spirometry need to do the breathing test.  We can do that at your convenience.  Or if you want to give the proton pump inhibitor for your esophagus a chance to work for a week or 2 first and see if that helps we can always wait.  Just let us know what you prefer.

## 2021-10-08 NOTE — Progress Notes (Signed)
[name], your chest x-ray is normal.

## 2021-10-10 ENCOUNTER — Telehealth: Payer: Self-pay

## 2021-10-10 NOTE — Telephone Encounter (Signed)
Initiated Prior authorization [id number] Sodium '40MG'$  dr tablets  Via: Covermymeds Case/Key:[id number]  Status: approved  as of 10/10/21 Reason:Effective from 10/10/2021 through 10/09/2022.  Notified Pt via: Mychart

## 2021-10-11 ENCOUNTER — Encounter: Payer: Self-pay | Admitting: Family Medicine

## 2021-10-16 ENCOUNTER — Encounter: Payer: Self-pay | Admitting: Family Medicine

## 2021-10-16 DIAGNOSIS — F4323 Adjustment disorder with mixed anxiety and depressed mood: Secondary | ICD-10-CM | POA: Diagnosis not present

## 2021-10-17 NOTE — Telephone Encounter (Signed)
I called patient and schedule her for spirometry.

## 2021-10-17 NOTE — Telephone Encounter (Signed)
She was supposed to schedule for spirometry after her lab results came back.  Not sure if she is on my schedule or not.  He can see if we can get her in next week.

## 2021-10-26 ENCOUNTER — Encounter: Payer: Self-pay | Admitting: Family Medicine

## 2021-10-26 DIAGNOSIS — F4323 Adjustment disorder with mixed anxiety and depressed mood: Secondary | ICD-10-CM | POA: Diagnosis not present

## 2021-10-26 DIAGNOSIS — T7840XA Allergy, unspecified, initial encounter: Secondary | ICD-10-CM

## 2021-10-26 NOTE — Telephone Encounter (Signed)
Orders Placed This Encounter  Procedures   Ambulatory referral to Allergy    Referral Priority:   Routine    Referral Type:   Allergy Testing    Referral Reason:   Specialty Services Required    Requested Specialty:   Allergy    Number of Visits Requested:   1

## 2021-11-08 DIAGNOSIS — R55 Syncope and collapse: Secondary | ICD-10-CM | POA: Diagnosis not present

## 2021-11-08 DIAGNOSIS — Z4509 Encounter for adjustment and management of other cardiac device: Secondary | ICD-10-CM | POA: Diagnosis not present

## 2021-11-10 DIAGNOSIS — Z4509 Encounter for adjustment and management of other cardiac device: Secondary | ICD-10-CM | POA: Diagnosis not present

## 2021-11-15 DIAGNOSIS — R202 Paresthesia of skin: Secondary | ICD-10-CM | POA: Diagnosis not present

## 2021-11-15 DIAGNOSIS — M5412 Radiculopathy, cervical region: Secondary | ICD-10-CM | POA: Diagnosis not present

## 2021-11-15 DIAGNOSIS — G609 Hereditary and idiopathic neuropathy, unspecified: Secondary | ICD-10-CM | POA: Diagnosis not present

## 2021-11-15 DIAGNOSIS — E559 Vitamin D deficiency, unspecified: Secondary | ICD-10-CM | POA: Diagnosis not present

## 2021-11-15 DIAGNOSIS — G603 Idiopathic progressive neuropathy: Secondary | ICD-10-CM | POA: Diagnosis not present

## 2021-11-15 DIAGNOSIS — Z79899 Other long term (current) drug therapy: Secondary | ICD-10-CM | POA: Diagnosis not present

## 2021-11-15 DIAGNOSIS — M5417 Radiculopathy, lumbosacral region: Secondary | ICD-10-CM | POA: Diagnosis not present

## 2021-11-19 ENCOUNTER — Ambulatory Visit (INDEPENDENT_AMBULATORY_CARE_PROVIDER_SITE_OTHER): Payer: BC Managed Care – PPO | Admitting: Family Medicine

## 2021-11-19 VITALS — BP 134/81 | HR 62 | Ht 64.0 in | Wt 146.0 lb

## 2021-11-19 DIAGNOSIS — R0602 Shortness of breath: Secondary | ICD-10-CM

## 2021-11-19 LAB — PULMONARY FUNCTION TEST
FEV1/FVC: 80.3 %
FEV1: 3.03 L
FVC: 3.78 L

## 2021-11-19 MED ORDER — ALBUTEROL SULFATE HFA 108 (90 BASE) MCG/ACT IN AERS: 2.0000 | INHALATION_SPRAY | Freq: Four times a day (QID) | RESPIRATORY_TRACT | 0 refills | Status: DC | PRN

## 2021-11-19 MED ORDER — ALBUTEROL SULFATE HFA 108 (90 BASE) MCG/ACT IN AERS
4.0000 | INHALATION_SPRAY | Freq: Once | RESPIRATORY_TRACT | Status: AC
  Administered 2021-11-19: 4 via RESPIRATORY_TRACT

## 2021-11-19 NOTE — Progress Notes (Signed)
Established Patient Office Visit  Subjective   Patient ID: [name], female    DOB: [birth year]  Age: 51 y.o. MRN: [id number]  Chief Complaint  Patient presents with   Shortness of Breath    HPI  Continues to have persistent shortness of breath.  Still wonders if it could have been triggered by COVID she says she will feel a sensation of tightness in her chest and then her voice will be raspy.  She is already being followed by cardiology for palpitations and she has a implanted device.  She did start a PPI and says that her acid reflux has been better but not her chest tightness or voice raspiness.  She does have her consultation with the allergist in about 2 weeks.  And she is here today for spirometry.  She also recently had an EGD with no significant or concerning findings.  We will call for copy of report as well as colonoscopy report.    ROS    Objective:     BP 134/81   Pulse 62   Ht '5\' 4"'$  (1.626 m)   Wt 146 lb (66.2 kg)   LMP 04/26/2016 (Exact Date)   SpO2 96%   BMI 25.06 kg/m    Physical Exam Vitals reviewed.  Constitutional:      Appearance: She is well-developed.  HENT:     Head: Normocephalic and atraumatic.  Eyes:     Conjunctiva/sclera: Conjunctivae normal.  Cardiovascular:     Rate and Rhythm: Normal rate.  Pulmonary:     Effort: Pulmonary effort is normal.  Skin:    General: Skin is dry.     Coloration: Skin is not pale.  Neurological:     Mental Status: She is alert and oriented to person, place, and time.  Psychiatric:        Behavior: Behavior normal.      No results found for any visits on 11/19/21.    The ASCVD Risk score (Arnett DK, et al., 2019) failed to calculate for the following reasons:   Cannot find a previous HDL lab   Cannot find a previous total cholesterol lab    Assessment & Plan:   Problem List Items Addressed This Visit   None Visit Diagnoses     Shortness of breath    -  Primary   Relevant Medications    albuterol (VENTOLIN HFA) 108 (90 Base) MCG/ACT inhaler 4 puff (Completed)   Other Relevant Orders   PR EVAL OF BRONCHOSPASM   Ambulatory referral to Pulmonology       Shortness of breath-unclear etiology we discussed pulmonary versus cardiac versus GERD/GI.  She is already had an endoscopy recently and was told that everything looked great.  I am calling to get a copy of that report since it was through [facility].  I cannot see the direct reports she is already on a PPI.  Spirometry honestly looks very reassuring today.  Spirometry today shows FVC of 107% and FEV1 of 108%.  Ratio of 80% post albuterol not interpretable.  Also consider that this could just be post-COVID symptoms.  Chest x-ray was normal no indication of scarring or nodules etc.  She is seeing the allergist in about 2 weeks and getting some allergy testing done to make sure there is not an environmental or allergic component to what she is experiencing.  We discussed moving forward with a pulmonary appointment they likely will not be able to see her until August but that  is fine she can at least get in with the allergist first.  No follow-ups on file.    [name], MD

## 2021-11-22 ENCOUNTER — Other Ambulatory Visit: Payer: Self-pay | Admitting: Specialist

## 2021-11-22 DIAGNOSIS — M5417 Radiculopathy, lumbosacral region: Secondary | ICD-10-CM

## 2021-11-22 DIAGNOSIS — M5412 Radiculopathy, cervical region: Secondary | ICD-10-CM

## 2021-11-27 DIAGNOSIS — F4323 Adjustment disorder with mixed anxiety and depressed mood: Secondary | ICD-10-CM | POA: Diagnosis not present

## 2021-11-30 ENCOUNTER — Encounter: Payer: Self-pay | Admitting: Family Medicine

## 2021-12-01 ENCOUNTER — Ambulatory Visit
Admission: RE | Admit: 2021-12-01 | Discharge: 2021-12-01 | Disposition: A | Discharge status: Home / Self Care | Payer: BC Managed Care – PPO | Source: Ambulatory Visit | Attending: Specialist | Admitting: Specialist

## 2021-12-01 DIAGNOSIS — M5417 Radiculopathy, lumbosacral region: Secondary | ICD-10-CM

## 2021-12-01 DIAGNOSIS — M5127 Other intervertebral disc displacement, lumbosacral region: Secondary | ICD-10-CM | POA: Diagnosis not present

## 2021-12-01 DIAGNOSIS — M47816 Spondylosis without myelopathy or radiculopathy, lumbar region: Secondary | ICD-10-CM | POA: Diagnosis not present

## 2021-12-01 DIAGNOSIS — M5412 Radiculopathy, cervical region: Secondary | ICD-10-CM

## 2021-12-01 DIAGNOSIS — M4312 Spondylolisthesis, cervical region: Secondary | ICD-10-CM | POA: Diagnosis not present

## 2021-12-01 DIAGNOSIS — M4316 Spondylolisthesis, lumbar region: Secondary | ICD-10-CM | POA: Diagnosis not present

## 2021-12-05 ENCOUNTER — Encounter: Payer: Self-pay | Admitting: Pulmonary Disease

## 2021-12-05 ENCOUNTER — Ambulatory Visit: Payer: BC Managed Care – PPO | Admitting: Pulmonary Disease

## 2021-12-05 VITALS — BP 118/70 | HR 70 | Temp 97.9°F | Ht 64.0 in | Wt 145.6 lb

## 2021-12-05 DIAGNOSIS — G4709 Other insomnia: Secondary | ICD-10-CM

## 2021-12-05 DIAGNOSIS — R0602 Shortness of breath: Secondary | ICD-10-CM | POA: Diagnosis not present

## 2021-12-05 MED ORDER — DIAZEPAM 5 MG PO TABS: 5.0000 mg | ORAL_TABLET | Freq: Every day | ORAL | 3 refills | Status: DC

## 2021-12-05 MED ORDER — FLUTICASONE FUROATE-VILANTEROL 200-25 MCG/ACT IN AEPB: 1.0000 | INHALATION_SPRAY | Freq: Every day | RESPIRATORY_TRACT | 2 refills | Status: DC

## 2021-12-05 NOTE — Progress Notes (Signed)
[name]    [id number]    18-Jan-1971  Primary Care Physician:Metheney, Rene Kocher, MD  Referring Physician: Hali Marry, [address],  [place] [address]  Chief complaint:   Patient with a history of chronic insomnia  HPI:  Insomnia is not any better  Has been having shortness of breath since the beginning of the year  Some difficulty swallowing for which she was evaluated by GI-did have upper and lower GI evaluation with no significant abnormality found  2 previous bouts with COVID but did recover well enough  Multiple medications have not helped insomnia  Temazepam did not help, Margarita Mail is not covered and very expensive Ambien, Belsomra did not work  Continues to have difficulty falling asleep, staying asleep She does have a history of cardiac dysrhythmia history of head trauma which she feels may have started off the battle with insomnia  Dad also did have issues with insomnia  History of snoring, teeth grinding Denies any other symptoms that she feels may be related to sleep apnea  Has tried different behavioral modifications to help insomnia to no avail Feels tired through the day, she feels this is just related to not getting enough hours of sleep  No history of sleep paralysis, no sleepwalking, sleep talking, no history suggestive of restless legs  Never smoker  Outpatient Encounter Medications as of 12/05/2021  Medication Sig   furosemide (LASIX) 20 MG tablet TK 1 T PO D   omeprazole (PRILOSEC) 40 MG capsule Take 40 mg by mouth daily.   temazepam (RESTORIL) 22.5 MG capsule Take 1 capsule (22.5 mg total) by mouth at bedtime as needed for sleep.   valACYclovir (VALTREX) 1000 MG tablet Take 1 tablet (1,000 mg total) by mouth daily.   [DISCONTINUED] linaclotide (LINZESS) 145 MCG CAPS capsule Take 1 capsule (145 mcg total) by mouth daily before breakfast.   [DISCONTINUED] pantoprazole (PROTONIX) 40 MG  tablet Take 1 tablet (40 mg total) by mouth daily.   [DISCONTINUED] traZODone (DESYREL) 50 MG tablet Take 1 tablet (50 mg total) by mouth at bedtime. (Patient not taking: Reported on 12/05/2021)   No facility-administered encounter medications on file as of 12/05/2021.    Allergies as of 12/05/2021 - Review Complete 12/05/2021  Allergen Reaction Noted   Demerol Nausea And Vomiting and Swelling 06/28/2011   Mobic [meloxicam] Palpitations 02/15/2020    Past Medical History:  Diagnosis Date   Asthma    allergy, exercise induced   Atrial tachycardia ([facility])    Decreased cortisol level ([facility])    H/O total hysterectomy with bilateral salpingo-oophorectomy (BSO) 04/29/2016   Lethargic    Ovarian cyst    PONV (postoperative nausea and vomiting)     Past Surgical History:  Procedure Laterality Date   ablasion     ANTERIOR AND POSTERIOR REPAIR N/A 04/29/2016   Procedure: ANTERIOR (CYSTOCELE) AND POSTERIOR REPAIR (RECTOCELE);  Surgeon: Janyth Contes, MD;  Location: [facility];  Service: Gynecology;  Laterality: N/A;   ANTERIOR CRUCIATE LIGAMENT REPAIR Left    AUGMENTATION MAMMAPLASTY Bilateral 10/2017   BLADDER SUSPENSION  04/29/2016   Procedure: Attempted TRANSVAGINAL TAPE (TVT) PROCEDURE, Cystotomy;  Surgeon: Cheri Fowler, MD;  Location: [facility];  Service: Gynecology;;   BREAST SURGERY     augmentation   CARDIAC ELECTROPHYSIOLOGY STUDY AND ABLATION     twice   CYSTOSCOPY N/A 04/29/2016   Procedure: CYSTOSCOPY;  Surgeon: Cheri Fowler, MD;  [facility];  Service: Gynecology;  Laterality: N/A;   LAPAROSCOPIC VAGINAL HYSTERECTOMY WITH SALPINGECTOMY Bilateral 04/29/2016   Procedure: LAPAROSCOPIC ASSISTED VAGINAL HYSTERECTOMY WITH Bilateral  SALPINGECTOMY;  Surgeon: Janyth Contes, MD;  Location: [facility];  Service: Gynecology;  Laterality: Bilateral;   MYRINGOTOMY     NASAL SEPTUM SURGERY     TONSILLECTOMY AND ADENOIDECTOMY     TOTAL LAPAROSCOPIC HYSTERECTOMY WITH  SALPINGECTOMY Bilateral 04/29/2016   TUBAL LIGATION     WISDOM TOOTH EXTRACTION      Family History  Problem Relation Age of Onset   Insomnia Father     Social History   Socioeconomic History   Marital status: Divorced    Spouse name: Not on file   Number of children: Not on file   Years of education: Not on file   Highest education level: Not on file  Occupational History   Not on file  Tobacco Use   Smoking status: Never   Smokeless tobacco: Never  Substance and Sexual Activity   Alcohol use: Yes   Drug use: No   Sexual activity: Not on file  Other Topics Concern   Not on file  Social History Narrative   Not on file   Social Determinants of Health   Financial Resource Strain: Not on file  Food Insecurity: Not on file  Transportation Needs: Not on file  Physical Activity: Not on file  Stress: Not on file  Social Connections: Not on file  Intimate Partner Violence: Not on file    Review of Systems  Constitutional:  Positive for fatigue.  Psychiatric/Behavioral:  Positive for sleep disturbance.     Vitals:   12/05/21 1423  BP: 118/70  Pulse: 70  Temp: 97.9 F (36.6 C)  SpO2: 100%     Physical Exam Constitutional:      Appearance: Normal appearance.  HENT:     Head: Normocephalic.     Mouth/Throat:     Mouth: Mucous membranes are moist.  Cardiovascular:     Rate and Rhythm: Normal rate and regular rhythm.     Heart sounds: No murmur heard.    No friction rub.  Pulmonary:     Effort: No respiratory distress.     Breath sounds: No stridor. No wheezing or rhonchi.  Musculoskeletal:     Cervical back: No rigidity or tenderness.  Neurological:     Mental Status: She is alert.  Psychiatric:        Mood and Affect: Mood normal.    Data Reviewed: Dr. Juanetta Gosling notes reviewed  Ferritin and blood work was within normal limits  Assessment:  Shortness of breath Shortness of breath with exertion -Recent spirometry within normal limits with no  significant bronchodilator response  Insomnia -Behavioral changes have not really helped -The only medication that she has been able to take that has helped is restarted at 22.5 mg  She had tried diazepam in the past which did help her get more sleep and felt better rested -Discussed low-dose diazepam as a trial if addition of trazodone does not help -Will place a prescription for 5 mg diazepam   Plan/Recommendations: Diazepam 5 mg nightly  Breo 1 puff daily to be used for shortness of breath  She will continue to use an oral appliance for bruxism  Follow-up in 3 months  Encouraged to call with any significant concerns  Sherrilyn Rist MD [facility] 12/05/2021, 2:28 PM  CC: Hali Marry, *

## 2021-12-05 NOTE — Patient Instructions (Addendum)
Prescription for Breo to be taken 1 puff daily, always make sure you rinse your mouth afterwards  Prescription for diazepam 5 mg daily  Follow-up in 3 months  Reassuringly your breathing study was normal  If swallowing difficulty continues, make sure you follow-up with a GI doctor  Call us with any significant concerns

## 2021-12-06 DIAGNOSIS — M5442 Lumbago with sciatica, left side: Secondary | ICD-10-CM | POA: Diagnosis not present

## 2021-12-06 DIAGNOSIS — G44229 Chronic tension-type headache, not intractable: Secondary | ICD-10-CM | POA: Diagnosis not present

## 2021-12-06 DIAGNOSIS — Z79899 Other long term (current) drug therapy: Secondary | ICD-10-CM | POA: Diagnosis not present

## 2021-12-06 DIAGNOSIS — F4323 Adjustment disorder with mixed anxiety and depressed mood: Secondary | ICD-10-CM | POA: Diagnosis not present

## 2021-12-06 DIAGNOSIS — M542 Cervicalgia: Secondary | ICD-10-CM | POA: Diagnosis not present

## 2021-12-10 ENCOUNTER — Other Ambulatory Visit (HOSPITAL_COMMUNITY): Payer: Self-pay

## 2021-12-10 DIAGNOSIS — R55 Syncope and collapse: Secondary | ICD-10-CM | POA: Diagnosis not present

## 2021-12-10 DIAGNOSIS — Z4509 Encounter for adjustment and management of other cardiac device: Secondary | ICD-10-CM | POA: Diagnosis not present

## 2021-12-10 NOTE — Progress Notes (Unsigned)
New Patient Note  RE: [name] MRN: [id number] DOB: 1970/09/19 Date of Office Visit: 12/11/2021  Consult requested by: Hali Marry, * Primary care provider: Hali Marry, MD  Chief Complaint: Shortness of Breath  History of Present Illness: I had the pleasure of seeing [name] for initial evaluation at the [facility] [place] [facility]  on 12/11/2021. She is a 51 y.o. female, who is referred here by Hali Marry, MD for the evaluation of shortness of breath and allergic rhinitis.  She reports symptoms of raspy voice, chest tightness, shortness of breath, wheezing, nocturnal awakenings for 7 months and worsening. Current medications include Breo 278mg 1 puff daily and albuterol prn with unknown benefit.  Patient had asthma as a child which resolved when she was an adult.   Main triggers are unknown. In the last month, frequency of symptoms: daily. Frequency of SABA use: depends. Interference with physical activity: yes. In the last 12 months, emergency room visits/urgent care visits/doctor office visits or hospitalizations due to respiratory issues: no. In the last 12 months, oral steroids courses: no. Lifetime history of hospitalization for respiratory issues: yes as a younger child. Prior intubations: no. History of pneumonia: yes - with Covid-19 in February 2022. She was evaluated by allergist/pulmonologist in the past. Smoking exposure: no. Up to date with flu vaccine: no. Up to date with COVID-19 vaccine: 2 vaccines. Prior Covid-19 infection: 3 times. History of reflux: yes and takes omeprazole '40mg'$  daily.  10/05/2021 CXR - negative chest.  Patient follows with cardiology for PACs and PVCs Saw pulmonology and spirometry was unremarkable.   Patient also had EGD and colonoscopy in May which was unremarkable.   She reports symptoms of rhinorrhea, PND, nasal congestion, watery eyes, ear fullness. Symptoms have been going on for 7 months. The  symptoms are present all year around.  She had allergic symptoms as a child which resolved in adulthood.  Other triggers include exposure to unknown. Anosmia: no. Headache: no. She has used pataday prn, Claritin with some improvement in symptoms. Sinus infections: one. Previous work up includes: as a child and was positive to grasses, trees, pet dander per patient report. [place]  Previous ENT evaluation: not recently, had T&A and tubes, rhinoplasty.  Previous sinus imaging: no. History of nasal polyps: no. Last eye exam: this year.  12/05/2021 pulmonology visit: "Shortness of breath with exertion -Recent spirometry within normal limits with no significant bronchodilator response  Breo 1 puff daily to be used for shortness of breath"  Assessment and Plan: PJesusitais a 51y.o. female with: Other allergic rhinitis History of allergies as a child [place]  The last 7 months noticed increased rhinoconjunctivitis symptoms.  Tried Claritin and Pataday eyedrops with some benefit. Had rhinoplasty, T&A and tubes in the past.  Today's skin testing showed: Positive to grass, weed, trees, mold. Borderline to ragweed. Start environmental control measures as below. Start Ryaltris (olopatadine + mometasone nasal spray combination) 1-2 sprays per nostril twice a day. Sample given. This replaces your other nasal sprays. If this works well for you, then have [facility] ship the medication to your home - prescription already sent in.  Nasal saline spray (i.e., Simply Saline) or nasal saline lavage (i.e., NeilMed) is recommended as needed and prior to medicated nasal sprays. Start Singulair (montelukast) '10mg'$  daily at night. Cautioned that in some children/adults can experience behavioral changes  including hyperactivity, agitation, depression, sleep disturbances and suicidal ideations. These side effects are rare, but if you  notice them you should notify me and discontinue Singulair (montelukast). Use over the counter antihistamines such as Zyrtec (cetirizine), Claritin (loratadine), Allegra (fexofenadine), or Xyzal (levocetirizine) daily as needed. May take twice a day during allergy flares. May switch antihistamines every few months. Use olopatadine eye drops 0.2% once a day as needed for itchy/watery eyes. Consider allergy injections for long term control if above medications do not help the symptoms - handout given.   Allergic conjunctivitis of both eyes See assessment and plan as above.  Shortness of breath History of asthma as an younger child and noted worsening shortness of breath, chest tightness and wheezing in the last 7 months.  Had COVID-19 3 times.  Seen by pulmonology and had normal spirometry and normal chest x-ray.  Doing a trial of Breo 200 mcg 1 puff daily.  Also evaluated by cardiology for PACs and PVCs. Continue with Breo 268mg 1 puff once a day and rinse mouth after each use as per pulmonology. Follow up with pulmonology.   Gastroesophageal reflux disease Had normal EGD. See handout for lifestyle and dietary modifications. Continue omeprazole '40mg'$  daily.  Return in about 2 months (around 02/11/2022).  Meds ordered this encounter  Medications   Olopatadine-Mometasone (RYALTRIS) 665-25 MCG/ACT SUSP    Sig: Place 1-2 sprays into the nose in the morning and at bedtime.    Dispense:  29 g    Refill:  5    [phone]  Olopatadine HCl 0.2 % SOLN    Sig: Apply 1 drop to eye daily as needed (itchy/watery eyes).    Dispense:  2.5 mL    Refill:  5   montelukast (SINGULAIR) 10 MG tablet    Sig: Take 1 tablet (10 mg total) by mouth at bedtime.    Dispense:  30 tablet    Refill:  5   Lab Orders  No laboratory test(s) ordered today    Other allergy screening: Food allergy: no Dietary History: patient has been eating other foods including milk, eggs, peanut, treenuts, sesame, shellfish,  fish, soy, wheat, meats, fruits and vegetables.  Medication allergy: no Hymenoptera allergy: no Urticaria: no Eczema:no History of recurrent infections suggestive of immunodeficency: no Frequent URIs as a child.  Diagnostics: Skin Testing: Environmental allergy panel and select foods. Positive to grass, weed, trees, mold. Borderline to ragweed. Negative to common foods.  Results discussed with patient/family.  Airborne Adult Perc - 12/11/21 1546     Time Antigen Placed 1546    Allergen Manufacturer GLavella Hammock   Location Back    Number of Test 59    Panel 1 Select    1. Control-Buffer 50% Glycerol Negative    2. Control-Histamine 1 mg/ml 3+    3. Albumin saline Negative    4. BMoab2+    5. BGuatemala3+    6. Johnson 3+    7. [place] Blue 3+    8. Meadow Fescue 3+    9. Perennial Rye Negative    10. Sweet Vernal 4+    11. Timothy 3+    12. Cocklebur Negative    13. Burweed Marshelder 2+    14. Ragweed, short --   +/-   15. Ragweed, Giant Negative    16. Plantain,  English 2+    17. Lamb's Quarters 2+    18. Sheep Sorrell --   +/-   19. Rough Pigweed Negative  [place], Rough Negative    21. Mugwort, Common --   +/-   22. Ash mix 2+    23. Birch mix Negative    24. Beech American 2+    25. Box, Elder Negative    26. Cedar, red Negative    27. Cottonwood, Eastern 3+    28. Elm mix Negative    29. Hickory 3+    30. Maple mix 2+    31. Oak, [place] mix Negative    32. Pecan Pollen 3+    33. Pine mix Negative    34. Sycamore Eastern Negative    35. Plantation, Black Pollen Negative    36. Alternaria alternata 3+    37. Cladosporium Herbarum Negative    38. Aspergillus mix Negative    39. Penicillium mix Negative    40. Bipolaris sorokiniana (Helminthosporium) Negative    41. Drechslera spicifera (Curvularia) Negative    42. Mucor plumbeus Negative    43. Fusarium moniliforme Negative    44. Aureobasidium pullulans (pullulara) Negative    45. Rhizopus oryzae  Negative    46. Botrytis cinera Negative    47. Epicoccum nigrum 2+    48. Phoma betae --   +/-   49. Candida Albicans Negative    50. Trichophyton mentagrophytes Negative    51. Mite, D Farinae  5,000 AU/ml Negative    52. Mite, D Pteronyssinus  5,000 AU/ml Negative    53. Cat Hair 10,000 BAU/ml Negative    54.  Dog Epithelia Negative    55. Mixed Feathers Negative    56. Horse Epithelia Negative    57. Cockroach, German Negative    58. Mouse Negative    59. Tobacco Leaf Negative             Food Perc - 12/11/21 1546       Test Information   Time Antigen Placed 1546    Allergen Manufacturer Lavella Hammock    Location Back    Number of allergen test [place]   1. Peanut Negative    2. Soybean food Negative    3. Wheat, whole Negative    4. Sesame Negative    5. Milk, cow Negative    6. Egg White, chicken Negative    7. Casein Negative    8. Shellfish mix Negative    9. Fish mix Negative    10. Cashew Negative             Intradermal - 12/11/21 1633     Time Antigen Placed 1633    Allergen Manufacturer Other    Location Arm    Number of Test 8    Control Negative    Mold 2 2+    Mold 3 Negative    Mold 4 --   +/-   Cat Negative    Dog Negative    Cockroach Negative    Mite mix Negative             Past Medical History: Patient Active Problem List   Diagnosis Date Noted   Other allergic rhinitis 12/11/2021   Gastroesophageal reflux disease 12/11/2021   Shortness of breath 12/11/2021   Allergic conjunctivitis of both eyes 12/11/2021   Gastroesophageal reflux disease with esophagitis without hemorrhage 02/05/2021   Stress and adjustment reaction 02/15/2020   Chronic constipation 02/15/2020   Primary insomnia 08/31/2018   HSV (herpes simplex virus) infection 07/23/2018   Pituitary  microadenoma ([place]) 07/23/2018   Hypocalcemia 05/27/2016   S/P laparoscopic assisted vaginal hysterectomy (LAVH) 04/29/2016   Hypocortisolemia ([place])  04/17/2016   Pituitary insufficiency ([place]) 04/17/2016   PFO (patent foramen ovale) 10/25/2015   Depression with anxiety 02/23/2015   Genital herpes 08/27/2012   PSVT (paroxysmal supraventricular tachycardia) ([place]) 12/13/2010   ASD (atrial septal defect) 12/13/2010   ALLERGIC ASTHMA 09/04/2009   PALPITATIONS 09/04/2009   HYPOKALEMIA, HX OF 09/04/2009   RESTLESS LEG SYNDROME 12/23/2008   CARPAL TUNNEL SYNDROME, BILATERAL 12/23/2008   UTI'S, RECURRENT 03/07/2008   PAIN IN THORACIC SPINE 12/22/2007   Past Medical History:  Diagnosis Date   Asthma    allergy, exercise induced   Atrial tachycardia (HCC)    Decreased cortisol level ([place])    H/O total hysterectomy with bilateral salpingo-oophorectomy (BSO) 04/29/2016   Lethargic    Ovarian cyst    PONV (postoperative nausea and vomiting)    Past Surgical History: Past Surgical History:  Procedure Laterality Date   ablasion     ANTERIOR AND POSTERIOR REPAIR N/A 04/29/2016   Procedure: ANTERIOR (CYSTOCELE) AND POSTERIOR REPAIR (RECTOCELE);  Surgeon: Janyth Contes, MD;  Location: [facility];  Service: Gynecology;  Laterality: N/A;   ANTERIOR CRUCIATE LIGAMENT REPAIR Left    AUGMENTATION MAMMAPLASTY Bilateral 10/2017   BLADDER SUSPENSION  04/29/2016   Procedure: Attempted TRANSVAGINAL TAPE (TVT) PROCEDURE, Cystotomy;  Surgeon: Cheri Fowler, MD;  Location: [facility];  Service: Gynecology;;   BREAST SURGERY     augmentation   CARDIAC ELECTROPHYSIOLOGY STUDY AND ABLATION     twice   CYSTOSCOPY N/A 04/29/2016   Procedure: CYSTOSCOPY;  Surgeon: Cheri Fowler, MD;  Location: [facility];  Service: Gynecology;  Laterality: N/A;   LAPAROSCOPIC VAGINAL HYSTERECTOMY WITH SALPINGECTOMY Bilateral 04/29/2016   Procedure: LAPAROSCOPIC ASSISTED VAGINAL HYSTERECTOMY WITH Bilateral  SALPINGECTOMY;  Surgeon: Janyth Contes, MD;  Location: [facility];  Service: Gynecology;  Laterality: Bilateral;   MYRINGOTOMY     NASAL SEPTUM SURGERY      TONSILLECTOMY AND ADENOIDECTOMY     TOTAL LAPAROSCOPIC HYSTERECTOMY WITH SALPINGECTOMY Bilateral 04/29/2016   TUBAL LIGATION     WISDOM TOOTH EXTRACTION     Medication List:  Current Outpatient Medications  Medication Sig Dispense Refill   diazepam (VALIUM) 5 MG tablet Take 1 tablet (5 mg total) by mouth daily. 30 tablet 3   DULoxetine (CYMBALTA) 60 MG capsule Take by mouth.     fluticasone furoate-vilanterol (BREO ELLIPTA) 200-25 MCG/ACT AEPB Inhale 1 puff into the lungs daily. 60 each 2   furosemide (LASIX) 20 MG tablet TK 1 T PO D     montelukast (SINGULAIR) 10 MG tablet Take 1 tablet (10 mg total) by mouth at bedtime. 30 tablet 5   Olopatadine HCl 0.2 % SOLN Apply 1 drop to eye daily as needed (itchy/watery eyes). 2.5 mL 5   Olopatadine-Mometasone (RYALTRIS) 665-25 MCG/ACT SUSP Place 1-2 sprays into the nose in the morning and at bedtime. 29 g 5   omeprazole (PRILOSEC) 40 MG capsule Take 40 mg by mouth daily.     TRULANCE 3 MG TABS Take 1 tablet by mouth daily.     valACYclovir (VALTREX) 1000 MG tablet Take 1 tablet (1,000 mg total) by mouth daily. 30 tablet 3   No current facility-administered medications for this visit.   Allergies: Allergies  Allergen Reactions   Demerol Nausea And Vomiting and Swelling   Mobic [Meloxicam] Palpitations    Caused my heart to race   Social History: Social History  Socioeconomic History   Marital status: Divorced    Spouse name: Not on file   Number of children: Not on file   Years of education: Not on file   Highest education level: Not on file  Occupational History   Not on file  Tobacco Use   Smoking status: Never    Passive exposure: Never   Smokeless tobacco: Never  Vaping Use   Vaping Use: Never used  Substance and Sexual Activity   Alcohol use: Yes   Drug use: No   Sexual activity: Not on file  Other Topics Concern   Not on file  Social History Narrative   Not on file   Social Determinants of Health   Financial  Resource Strain: Not on file  Food Insecurity: Not on file  Transportation Needs: Not on file  Physical Activity: Not on file  Stress: Not on file  Social Connections: Not on file   Lives in a 50 year old house. 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  HENT:  Positive for congestion, postnasal drip and rhinorrhea.   Eyes:  Negative for itching.  Respiratory:  Positive for cough, chest tightness and shortness of breath. Negative for wheezing.   Cardiovascular:  Negative for chest pain.  Gastrointestinal:  Negative for abdominal pain.  Genitourinary:  Negative for difficulty urinating.  Skin:  Negative for rash.  Allergic/Immunologic: Positive for environmental allergies. Negative for food allergies.  Neurological:  Negative for headaches.    Objective: BP 110/76   Pulse 60   Temp 98.1 F (36.7 C) (Temporal)   Resp 12   Ht '5\' 4"'$  (1.626 m)   Wt 146 lb (66.2 kg)   LMP 04/26/2016 (Exact Date)   SpO2 100%   BMI 25.06 kg/m  Body mass index is 25.06 kg/m. Physical Exam Vitals and nursing note reviewed.  Constitutional:      Appearance: Normal appearance. She is well-developed.  HENT:     Head: Normocephalic and atraumatic.     Right Ear: Tympanic membrane and external ear normal.     Left Ear: Tympanic membrane and external ear normal.     Nose: Nose normal.     Mouth/Throat:     Mouth: Mucous membranes are moist.      Pharynx: Oropharynx is clear.  Eyes:     Conjunctiva/sclera: Conjunctivae normal.  Cardiovascular:     Rate and Rhythm: Normal rate and regular rhythm.     Heart sounds: Normal heart sounds. No murmur heard.    No friction rub. No gallop.  Pulmonary:     Effort: Pulmonary effort is normal.     Breath sounds: Normal breath sounds. No wheezing, rhonchi or rales.  Musculoskeletal:     Cervical back: Neck supple.  Skin:    General: Skin is warm.     Findings: No rash.  Neurological:     Mental  Status: She is alert and oriented to person, place, and time.  Psychiatric:        Behavior: Behavior normal.   The plan was reviewed with the patient/family, and all questions/concerned were addressed.  It was my pleasure to see Julliana today and participate in her care. Please feel free to contact me with any questions or concerns.  Sincerely,  Rexene Alberts, DO Allergy & Immunology  [facility] office: [place] office: [phone]

## 2021-12-11 ENCOUNTER — Encounter: Payer: Self-pay | Admitting: Allergy

## 2021-12-11 ENCOUNTER — Ambulatory Visit: Payer: BC Managed Care – PPO | Admitting: Allergy

## 2021-12-11 VITALS — BP 110/76 | HR 60 | Temp 98.1°F | Resp 12 | Ht 64.0 in | Wt 146.0 lb

## 2021-12-11 DIAGNOSIS — J3089 Other allergic rhinitis: Secondary | ICD-10-CM | POA: Insufficient documentation

## 2021-12-11 DIAGNOSIS — R0602 Shortness of breath: Secondary | ICD-10-CM | POA: Diagnosis not present

## 2021-12-11 DIAGNOSIS — K219 Gastro-esophageal reflux disease without esophagitis: Secondary | ICD-10-CM | POA: Diagnosis not present

## 2021-12-11 DIAGNOSIS — Z8709 Personal history of other diseases of the respiratory system: Secondary | ICD-10-CM | POA: Diagnosis not present

## 2021-12-11 DIAGNOSIS — Z4509 Encounter for adjustment and management of other cardiac device: Secondary | ICD-10-CM | POA: Diagnosis not present

## 2021-12-11 DIAGNOSIS — H1013 Acute atopic conjunctivitis, bilateral: Secondary | ICD-10-CM

## 2021-12-11 MED ORDER — MONTELUKAST SODIUM 10 MG PO TABS: 10.0000 mg | ORAL_TABLET | Freq: Every day | ORAL | 5 refills | Status: DC

## 2021-12-11 MED ORDER — RYALTRIS 665-25 MCG/ACT NA SUSP: 1.0000 | Freq: Two times a day (BID) | NASAL | 5 refills | Status: DC

## 2021-12-11 MED ORDER — OLOPATADINE HCL 0.2 % OP SOLN: 1.0000 [drp] | Freq: Every day | OPHTHALMIC | 5 refills | Status: DC | PRN

## 2021-12-11 NOTE — Assessment & Plan Note (Addendum)
Had normal EGD.  See handout for lifestyle and dietary modifications.  Continue omeprazole '40mg'$  daily.

## 2021-12-11 NOTE — Assessment & Plan Note (Signed)
History of asthma as an younger child and noted worsening shortness of breath, chest tightness and wheezing in the last 7 months.  Had COVID-19 3 times.  Seen by pulmonology and had normal spirometry and normal chest x-ray.  Doing a trial of Breo 200 mcg 1 puff daily.  Also evaluated by cardiology for PACs and PVCs. . Continue with Breo 218mg 1 puff once a day and rinse mouth after each use as per pulmonology. . Follow up with pulmonology.

## 2021-12-11 NOTE — Assessment & Plan Note (Signed)
.   See assessment and plan as above. 

## 2021-12-11 NOTE — Assessment & Plan Note (Addendum)
History of allergies as a child and was on AIT in [place] with good benefit.  The last 7 months noticed increased rhinoconjunctivitis symptoms.  Tried Claritin and Pataday eyedrops with some benefit. Had rhinoplasty, T&A and tubes in the past.   Today's skin testing showed: Positive to grass, weed, trees, mold. Borderline to ragweed.  Start environmental control measures as below.  Start Ryaltris (olopatadine + mometasone nasal spray combination) 1-2 sprays per nostril twice a day. Sample given.  This replaces your other nasal sprays.  If this works well for you, then have [facility] ship the medication to your home - prescription already sent in.   Nasal saline spray (i.e., Simply Saline) or nasal saline lavage (i.e., NeilMed) is recommended as needed and prior to medicated nasal sprays.  Start Singulair (montelukast) '10mg'$  daily at night.  Cautioned that in some children/adults can experience behavioral changes including hyperactivity, agitation, depression, sleep disturbances and suicidal ideations. These side effects are rare, but if you notice them you should notify me and discontinue Singulair (montelukast).  Use over the counter antihistamines such as Zyrtec (cetirizine), Claritin (loratadine), Allegra (fexofenadine), or Xyzal (levocetirizine) daily as needed. May take twice a day during allergy flares. May switch antihistamines every few months.  Use olopatadine eye drops 0.2% once a day as needed for itchy/watery eyes.  Consider allergy injections for long term control if above medications do not help the symptoms - handout given.

## 2021-12-11 NOTE — Patient Instructions (Addendum)
Today's skin testing showed: Positive to grass, weed, trees, mold. Borderline to ragweed. Negative to common foods.   Results given.  Environmental allergies Start environmental control measures as below. Start Ryaltris (olopatadine + mometasone nasal spray combination) 1-2 sprays per nostril twice a day. Sample given. This replaces your other nasal sprays. If this works well for you, then have [facility] ship the medication to your home - prescription already sent in.  Nasal saline spray (i.e., Simply Saline) or nasal saline lavage (i.e., NeilMed) is recommended as needed and prior to medicated nasal sprays. Start Singulair (montelukast) '10mg'$  daily at night. Cautioned that in some children/adults can experience behavioral changes including hyperactivity, agitation, depression, sleep disturbances and suicidal ideations. These side effects are rare, but if you notice them you should notify me and discontinue Singulair (montelukast). Use over the counter antihistamines such as Zyrtec (cetirizine), Claritin (loratadine), Allegra (fexofenadine), or Xyzal (levocetirizine) daily as needed. May take twice a day during allergy flares. May switch antihistamines every few months. Use olopatadine eye drops 0.2% once a day as needed for itchy/watery eyes. Consider allergy injections for long term control if above medications do not help the symptoms - handout given.   Breathing Continue with Breo 27mg 1 puff once a day and rinse mouth after each use as per pulmonology. Follow up with pulmonology.   Heartburn: See handout for lifestyle and dietary modifications. Continue omeprazole '40mg'$  daily.  Follow up in 2 months or sooner if needed.    Reducing Pollen Exposure Pollen seasons: trees (spring), grass (summer) and ragweed/weeds (fall). Keep windows closed in your home and car to lower pollen exposure.  Install air conditioning in the bedroom and throughout the house if possible.  Avoid going out  in dry windy days - especially early morning. Pollen counts are highest between 5 - 10 AM and on dry, hot and windy days.  Save outside activities for late afternoon or after a heavy rain, when pollen levels are lower.  Avoid mowing of grass if you have grass pollen allergy. Be aware that pollen can also be transported indoors on people and pets.  Dry your clothes in an automatic dryer rather than hanging them outside where they might collect pollen.  Rinse hair and eyes before bedtime.  Mold Control Mold and fungi can grow on a variety of surfaces provided certain temperature and moisture conditions exist.  Outdoor molds grow on plants, decaying vegetation and soil. The major outdoor mold, Alternaria and Cladosporium, are found in very high numbers during hot and dry conditions. Generally, a late summer - fall peak is seen for common outdoor fungal spores. Rain will temporarily lower outdoor mold spore count, but counts rise rapidly when the rainy period ends. The most important indoor molds are Aspergillus and Penicillium. Dark, humid and poorly ventilated basements are ideal sites for mold growth. The next most common sites of mold growth are the bathroom and the kitchen. Outdoor (Seasonal) Mold Control Use air conditioning and keep windows closed. Avoid exposure to decaying vegetation. Avoid leaf raking. Avoid grain handling. Consider wearing a face mask if working in moldy areas.  Indoor (Perennial) Mold Control  Maintain humidity below 50%. Get rid of mold growth on hard surfaces with water, detergent and, if necessary, 5% bleach (do not mix with other cleaners). Then dry the area completely. If mold covers an area more than 10 square feet, consider hiring an indoor environmental professional. For clothing, washing with soap and water is best. If moldy items cannot be  cleaned and dried, throw them away. Remove sources e.g. contaminated carpets. Repair and seal leaking roofs or pipes.  Using dehumidifiers in damp basements may be helpful, but empty the water and clean units regularly to prevent mildew from forming. All rooms, especially basements, bathrooms and kitchens, require ventilation and cleaning to deter mold and mildew growth. Avoid carpeting on concrete or damp floors, and storing items in damp areas.

## 2021-12-21 ENCOUNTER — Telehealth: Payer: Self-pay

## 2021-12-21 DIAGNOSIS — F418 Other specified anxiety disorders: Secondary | ICD-10-CM

## 2021-12-21 DIAGNOSIS — F4323 Adjustment disorder with mixed anxiety and depressed mood: Secondary | ICD-10-CM | POA: Diagnosis not present

## 2021-12-21 MED ORDER — TEMAZEPAM 22.5 MG PO CAPS
22.5000 mg | ORAL_CAPSULE | Freq: Every evening | ORAL | 3 refills | Status: DC | PRN
Start: 2021-12-21 — End: 2022-05-24

## 2021-12-21 NOTE — Telephone Encounter (Signed)
Ordered

## 2021-12-21 NOTE — Telephone Encounter (Signed)
Routing to covering provider.   [facility] pharmacy requesting med refill for temazepam. Rx not listed in active med list.

## 2021-12-25 ENCOUNTER — Ambulatory Visit: Payer: BC Managed Care – PPO | Admitting: Family Medicine

## 2021-12-25 DIAGNOSIS — R519 Headache, unspecified: Secondary | ICD-10-CM | POA: Diagnosis not present

## 2021-12-25 DIAGNOSIS — Z1152 Encounter for screening for COVID-19: Secondary | ICD-10-CM | POA: Diagnosis not present

## 2021-12-25 DIAGNOSIS — J029 Acute pharyngitis, unspecified: Secondary | ICD-10-CM | POA: Diagnosis not present

## 2021-12-25 DIAGNOSIS — Z03818 Encounter for observation for suspected exposure to other biological agents ruled out: Secondary | ICD-10-CM | POA: Diagnosis not present

## 2021-12-26 DIAGNOSIS — G894 Chronic pain syndrome: Secondary | ICD-10-CM | POA: Diagnosis not present

## 2021-12-26 DIAGNOSIS — M542 Cervicalgia: Secondary | ICD-10-CM | POA: Diagnosis not present

## 2021-12-26 DIAGNOSIS — M47816 Spondylosis without myelopathy or radiculopathy, lumbar region: Secondary | ICD-10-CM | POA: Diagnosis not present

## 2021-12-29 ENCOUNTER — Encounter (INDEPENDENT_AMBULATORY_CARE_PROVIDER_SITE_OTHER): Payer: BC Managed Care – PPO | Admitting: Family Medicine

## 2021-12-29 DIAGNOSIS — B370 Candidal stomatitis: Secondary | ICD-10-CM

## 2021-12-31 DIAGNOSIS — B370 Candidal stomatitis: Secondary | ICD-10-CM | POA: Diagnosis not present

## 2021-12-31 MED ORDER — NYSTATIN 100000 UNIT/ML MT SUSP: 5.0000 mL | Freq: Four times a day (QID) | OROMUCOSAL | 0 refills | Status: AC

## 2021-12-31 NOTE — Telephone Encounter (Signed)

## 2022-01-10 DIAGNOSIS — Z4509 Encounter for adjustment and management of other cardiac device: Secondary | ICD-10-CM | POA: Diagnosis not present

## 2022-01-10 DIAGNOSIS — R002 Palpitations: Secondary | ICD-10-CM | POA: Diagnosis not present

## 2022-01-10 DIAGNOSIS — Z95818 Presence of other cardiac implants and grafts: Secondary | ICD-10-CM | POA: Diagnosis not present

## 2022-01-10 DIAGNOSIS — R55 Syncope and collapse: Secondary | ICD-10-CM | POA: Diagnosis not present

## 2022-01-18 DIAGNOSIS — F4323 Adjustment disorder with mixed anxiety and depressed mood: Secondary | ICD-10-CM | POA: Diagnosis not present

## 2022-01-23 DIAGNOSIS — L719 Rosacea, unspecified: Secondary | ICD-10-CM | POA: Diagnosis not present

## 2022-01-23 DIAGNOSIS — D1801 Hemangioma of skin and subcutaneous tissue: Secondary | ICD-10-CM | POA: Diagnosis not present

## 2022-01-23 DIAGNOSIS — L570 Actinic keratosis: Secondary | ICD-10-CM | POA: Diagnosis not present

## 2022-01-23 DIAGNOSIS — L821 Other seborrheic keratosis: Secondary | ICD-10-CM | POA: Diagnosis not present

## 2022-02-05 DIAGNOSIS — Z9889 Other specified postprocedural states: Secondary | ICD-10-CM | POA: Diagnosis not present

## 2022-02-05 DIAGNOSIS — Z8679 Personal history of other diseases of the circulatory system: Secondary | ICD-10-CM | POA: Diagnosis not present

## 2022-02-07 DIAGNOSIS — N393 Stress incontinence (female) (male): Secondary | ICD-10-CM | POA: Diagnosis not present

## 2022-02-07 DIAGNOSIS — N816 Rectocele: Secondary | ICD-10-CM | POA: Diagnosis not present

## 2022-02-11 DIAGNOSIS — Z79899 Other long term (current) drug therapy: Secondary | ICD-10-CM | POA: Diagnosis not present

## 2022-02-11 DIAGNOSIS — I10 Essential (primary) hypertension: Secondary | ICD-10-CM | POA: Diagnosis not present

## 2022-02-11 DIAGNOSIS — I491 Atrial premature depolarization: Secondary | ICD-10-CM | POA: Diagnosis not present

## 2022-02-11 DIAGNOSIS — M5030 Other cervical disc degeneration, unspecified cervical region: Secondary | ICD-10-CM | POA: Diagnosis not present

## 2022-02-11 DIAGNOSIS — Z95828 Presence of other vascular implants and grafts: Secondary | ICD-10-CM | POA: Diagnosis not present

## 2022-02-11 DIAGNOSIS — N8181 Perineocele: Secondary | ICD-10-CM | POA: Diagnosis not present

## 2022-02-11 DIAGNOSIS — N816 Rectocele: Secondary | ICD-10-CM | POA: Diagnosis not present

## 2022-02-11 DIAGNOSIS — M5136 Other intervertebral disc degeneration, lumbar region: Secondary | ICD-10-CM | POA: Diagnosis not present

## 2022-02-11 DIAGNOSIS — I471 Supraventricular tachycardia: Secondary | ICD-10-CM | POA: Diagnosis not present

## 2022-02-11 DIAGNOSIS — I493 Ventricular premature depolarization: Secondary | ICD-10-CM | POA: Diagnosis not present

## 2022-02-11 DIAGNOSIS — K219 Gastro-esophageal reflux disease without esophagitis: Secondary | ICD-10-CM | POA: Diagnosis not present

## 2022-02-11 DIAGNOSIS — Z45018 Encounter for adjustment and management of other part of cardiac pacemaker: Secondary | ICD-10-CM | POA: Diagnosis not present

## 2022-02-11 DIAGNOSIS — N393 Stress incontinence (female) (male): Secondary | ICD-10-CM | POA: Diagnosis not present

## 2022-02-11 DIAGNOSIS — E2749 Other adrenocortical insufficiency: Secondary | ICD-10-CM | POA: Diagnosis not present

## 2022-02-11 DIAGNOSIS — R55 Syncope and collapse: Secondary | ICD-10-CM | POA: Diagnosis not present

## 2022-02-11 DIAGNOSIS — G2581 Restless legs syndrome: Secondary | ICD-10-CM | POA: Diagnosis not present

## 2022-02-11 DIAGNOSIS — D352 Benign neoplasm of pituitary gland: Secondary | ICD-10-CM | POA: Diagnosis not present

## 2022-02-13 DIAGNOSIS — Z4509 Encounter for adjustment and management of other cardiac device: Secondary | ICD-10-CM | POA: Diagnosis not present

## 2022-02-14 ENCOUNTER — Ambulatory Visit: Payer: BC Managed Care – PPO | Admitting: Allergy

## 2022-02-28 DIAGNOSIS — N952 Postmenopausal atrophic vaginitis: Secondary | ICD-10-CM | POA: Diagnosis not present

## 2022-02-28 DIAGNOSIS — N760 Acute vaginitis: Secondary | ICD-10-CM | POA: Diagnosis not present

## 2022-02-28 DIAGNOSIS — R82998 Other abnormal findings in urine: Secondary | ICD-10-CM | POA: Diagnosis not present

## 2022-02-28 DIAGNOSIS — Z87448 Personal history of other diseases of urinary system: Secondary | ICD-10-CM | POA: Diagnosis not present

## 2022-03-04 DIAGNOSIS — F4323 Adjustment disorder with mixed anxiety and depressed mood: Secondary | ICD-10-CM | POA: Diagnosis not present

## 2022-03-14 DIAGNOSIS — R55 Syncope and collapse: Secondary | ICD-10-CM | POA: Diagnosis not present

## 2022-03-14 DIAGNOSIS — Z4509 Encounter for adjustment and management of other cardiac device: Secondary | ICD-10-CM | POA: Diagnosis not present

## 2022-03-14 DIAGNOSIS — F4323 Adjustment disorder with mixed anxiety and depressed mood: Secondary | ICD-10-CM | POA: Diagnosis not present

## 2022-04-18 DIAGNOSIS — F4323 Adjustment disorder with mixed anxiety and depressed mood: Secondary | ICD-10-CM | POA: Diagnosis not present

## 2022-05-14 ENCOUNTER — Encounter: Payer: Self-pay | Admitting: Family Medicine

## 2022-05-14 NOTE — Telephone Encounter (Signed)
Called patient to schedule appointment no answer left message .

## 2022-05-15 ENCOUNTER — Ambulatory Visit (INDEPENDENT_AMBULATORY_CARE_PROVIDER_SITE_OTHER): Payer: BC Managed Care – PPO | Admitting: Family Medicine

## 2022-05-15 ENCOUNTER — Encounter: Payer: Self-pay | Admitting: Family Medicine

## 2022-05-15 VITALS — BP 114/75 | HR 66 | Temp 98.0°F | Ht 64.0 in | Wt 137.3 lb

## 2022-05-15 DIAGNOSIS — N3001 Acute cystitis with hematuria: Secondary | ICD-10-CM | POA: Diagnosis not present

## 2022-05-15 DIAGNOSIS — R300 Dysuria: Secondary | ICD-10-CM

## 2022-05-15 LAB — POCT URINALYSIS DIP (CLINITEK)
Bilirubin, UA: NEGATIVE
Glucose, UA: NEGATIVE mg/dL
Ketones, POC UA: NEGATIVE mg/dL
Leukocytes, UA: NEGATIVE
Nitrite, UA: NEGATIVE
POC PROTEIN,UA: NEGATIVE
Spec Grav, UA: 1.02 (ref 1.010–1.025)
Urobilinogen, UA: 0.2 E.U./dL
pH, UA: 7 (ref 5.0–8.0)

## 2022-05-15 MED ORDER — CIPROFLOXACIN HCL 500 MG PO TABS: 500.0000 mg | ORAL_TABLET | Freq: Two times a day (BID) | ORAL | 0 refills | Status: AC

## 2022-05-15 NOTE — Progress Notes (Signed)
Acute Office Visit  Subjective:     Patient ID: [name], female    DOB: 24-Nov-1970, 51 y.o.   MRN: [id number]  Chief Complaint  Patient presents with   Acute Visit    Patient in office c/o x yesterday a.m. - urinary odor and dysuria after urination, - took one penicllin yesterday back and Left side "kidney pain " x 1 week and cystex occasionally - bladder surgery in September 2023- bladder sling- infection following placement but no problems since this.     HPI This is a new problem. Presents today for an acute visit with complaint of urinary burning with finishing, frequency not as bad now. Left flank pain. Bladder surgery in February 11, 2022, has not had surgical follow-up yet.  Symptoms have been present for one day, yesterday morning.  Associated symptoms include: no fever or chills, left flank pain.  Treatments tried include : cystex, took one penicillin dose yesterday Treatment effective : has helped symptoms some  Sick contacts : n/a   Review of Systems  Constitutional:  Negative for chills and fever.  Genitourinary:  Positive for dysuria, flank pain and frequency (resolved today.). Negative for hematuria.        Objective:    BP 114/75   Pulse 66   Temp 98 F (36.7 C)   Ht '5\' 4"'$  (1.626 m)   Wt 137 lb 5 oz (62.3 kg)   SpO2 99%   BMI 23.57 kg/m    Physical Exam Vitals and nursing note reviewed.  Constitutional:      Appearance: Normal appearance.  Cardiovascular:     Rate and Rhythm: Normal rate and regular rhythm.     Heart sounds: Normal heart sounds.  Pulmonary:     Effort: Pulmonary effort is normal.     Breath sounds: Normal breath sounds.  Abdominal:     Tenderness: There is left CVA tenderness. There is no right CVA tenderness.  Skin:    General: Skin is warm and dry.  Neurological:     General: No focal deficit present.     Mental Status: She is alert. Mental status is at baseline.  Psychiatric:        Mood and Affect: Mood  normal.        Behavior: Behavior normal.     No results found for any visits on 05/15/22.      Assessment & Plan:   Problem List Items Addressed This Visit     UTI'S, RECURRENT - Primary   Relevant Medications   ciprofloxacin (CIPRO) 500 MG tablet   Dysuria   Relevant Orders   POCT URINALYSIS DIP (CLINITEK)   Urine Culture    Meds ordered this encounter  Medications   ciprofloxacin (CIPRO) 500 MG tablet    Sig: Take 1 tablet (500 mg total) by mouth 2 (two) times daily for 7 days.    Dispense:  14 tablet    Refill:  0    Order Specific Question:   Supervising Provider    Answer:   Leeanne Rio [[id number]]  Urine dip with trace blood. Will send for culture.  Left flank pain with history of recent bladder surgery. Had vaginal infection following this surgery, was seen on 10/12 by urology for this. No fever or chills. Took one dose of PCN which may have altered the urine dip results.  Due to flank pain, will treat with Ciprofloxacin, has tolerated in the past. Increase fluids. Will notify if antibiotic needs to  be changed.  Recommend rechecking urine to ensure resolution of hematuria. Follow-up with urology ASAP.  Symptoms reviewed that warrant seeking higher level of care.  Agrees with plan of care discussed today. Questions answered.    Return if symptoms worsen or fail to improve.  Chalmers Guest, FNP

## 2022-05-15 NOTE — Patient Instructions (Addendum)
Recommend  having urine rechecked to ensure the blood resolves.

## 2022-05-16 DIAGNOSIS — R55 Syncope and collapse: Secondary | ICD-10-CM | POA: Diagnosis not present

## 2022-05-16 DIAGNOSIS — Z4509 Encounter for adjustment and management of other cardiac device: Secondary | ICD-10-CM | POA: Diagnosis not present

## 2022-05-17 ENCOUNTER — Encounter: Payer: Self-pay | Admitting: Family Medicine

## 2022-05-17 DIAGNOSIS — Z95818 Presence of other cardiac implants and grafts: Secondary | ICD-10-CM | POA: Diagnosis not present

## 2022-05-17 DIAGNOSIS — R55 Syncope and collapse: Secondary | ICD-10-CM | POA: Diagnosis not present

## 2022-05-17 LAB — URINE CULTURE: Organism ID, Bacteria: NO GROWTH

## 2022-05-24 ENCOUNTER — Other Ambulatory Visit: Payer: Self-pay | Admitting: *Deleted

## 2022-05-24 MED ORDER — TEMAZEPAM 22.5 MG PO CAPS: 22.5000 mg | ORAL_CAPSULE | Freq: Every evening | ORAL | 3 refills | Status: DC | PRN

## 2022-05-24 NOTE — Telephone Encounter (Signed)
Called patient and scheduled an appointment for 05/27/2022 at 1:00pm for mychart visit. Patient states she has Covid and has been having difficulty sleeping. Patient wants to know if she can get something called into her pharmacy until appointment.

## 2022-05-24 NOTE — Telephone Encounter (Signed)
Please call pt and advise her that she will need to schedule a f/u for medication refills ok for virtual.

## 2022-05-27 ENCOUNTER — Telehealth (INDEPENDENT_AMBULATORY_CARE_PROVIDER_SITE_OTHER): Payer: BC Managed Care – PPO | Admitting: Family Medicine

## 2022-05-27 DIAGNOSIS — L659 Nonscarring hair loss, unspecified: Secondary | ICD-10-CM

## 2022-05-27 DIAGNOSIS — F5101 Primary insomnia: Secondary | ICD-10-CM | POA: Diagnosis not present

## 2022-05-27 NOTE — Progress Notes (Unsigned)
   Established Patient Office Visit  Subjective   Patient ID: [name], female    DOB: Dec 30, 1970  Age: 52 y.o. MRN: [id number]  Chief Complaint  Patient presents with   Insomnia    HPI  Dx with COVID a week ago.  She is feeling much better.    Started losing her hair again. Had surgery in September.  Also just had COVID but she has been losing large amounts of hair.  She has had this happen before but it did grow back in and she started taking some supplements recently, to try to help with the hair loss.  F/U sleep-take it every night. Has been working on using music and reducing stress.   {History (Optional):23778}  ROS    Objective:     There were no vitals taken for this visit. {Vitals History (Optional):23777}  Physical Exam Vitals and nursing note reviewed.  Constitutional:      Appearance: She is well-developed.  HENT:     Head: Normocephalic and atraumatic.  Pulmonary:     Effort: Pulmonary effort is normal.  Neurological:     Mental Status: She is alert and oriented to person, place, and time.  Psychiatric:        Behavior: Behavior normal.      No results found for any visits on 05/27/22.  {Labs (Optional):23779}  The ASCVD Risk score (Arnett DK, et al., 2019) failed to calculate for the following reasons:   Cannot find a previous HDL lab   Cannot find a previous total cholesterol lab    Assessment & Plan:   Problem List Items Addressed This Visit       Other   Primary insomnia - Primary   Relevant Orders   Ambulatory referral to Sleep Studies    No follow-ups on file.    [name], MD

## 2022-05-28 ENCOUNTER — Encounter: Payer: Self-pay | Admitting: Family Medicine

## 2022-05-28 DIAGNOSIS — L659 Nonscarring hair loss, unspecified: Secondary | ICD-10-CM | POA: Insufficient documentation

## 2022-05-28 DIAGNOSIS — L650 Telogen effluvium: Secondary | ICD-10-CM | POA: Diagnosis not present

## 2022-05-28 DIAGNOSIS — D239 Other benign neoplasm of skin, unspecified: Secondary | ICD-10-CM | POA: Diagnosis not present

## 2022-05-28 NOTE — Assessment & Plan Note (Signed)
Temazepam was refilled.  We discussed getting in with a neurologist based board-certified sleep medicine specialist.  Will go ahead and place referral.

## 2022-05-28 NOTE — Assessment & Plan Note (Signed)
Please secondary to recent surgery and having had COVID.  She is taking a supplement to help with growth we also discussed the possibility of Rogaine for women over-the-counter.  Hopefully it will cycle back and she will get new growth in but if not please let us know we can always refer to dermatology for further evaluation.

## 2022-06-07 DIAGNOSIS — F4323 Adjustment disorder with mixed anxiety and depressed mood: Secondary | ICD-10-CM | POA: Diagnosis not present

## 2022-06-17 DIAGNOSIS — Z4509 Encounter for adjustment and management of other cardiac device: Secondary | ICD-10-CM | POA: Diagnosis not present

## 2022-06-17 DIAGNOSIS — R55 Syncope and collapse: Secondary | ICD-10-CM | POA: Diagnosis not present

## 2022-07-05 DIAGNOSIS — H8111 Benign paroxysmal vertigo, right ear: Secondary | ICD-10-CM | POA: Diagnosis not present

## 2022-07-05 DIAGNOSIS — H9313 Tinnitus, bilateral: Secondary | ICD-10-CM | POA: Diagnosis not present

## 2022-07-05 DIAGNOSIS — H6993 Unspecified Eustachian tube disorder, bilateral: Secondary | ICD-10-CM | POA: Diagnosis not present

## 2022-07-05 DIAGNOSIS — R42 Dizziness and giddiness: Secondary | ICD-10-CM | POA: Diagnosis not present

## 2022-07-05 DIAGNOSIS — H903 Sensorineural hearing loss, bilateral: Secondary | ICD-10-CM | POA: Diagnosis not present

## 2022-07-17 DIAGNOSIS — F4323 Adjustment disorder with mixed anxiety and depressed mood: Secondary | ICD-10-CM | POA: Diagnosis not present

## 2022-07-18 DIAGNOSIS — R55 Syncope and collapse: Secondary | ICD-10-CM | POA: Diagnosis not present

## 2022-07-18 DIAGNOSIS — R002 Palpitations: Secondary | ICD-10-CM | POA: Diagnosis not present

## 2022-07-18 DIAGNOSIS — Z95818 Presence of other cardiac implants and grafts: Secondary | ICD-10-CM | POA: Diagnosis not present

## 2022-08-08 DIAGNOSIS — F4323 Adjustment disorder with mixed anxiety and depressed mood: Secondary | ICD-10-CM | POA: Diagnosis not present

## 2022-08-19 DIAGNOSIS — R55 Syncope and collapse: Secondary | ICD-10-CM | POA: Diagnosis not present

## 2022-08-19 DIAGNOSIS — Z95818 Presence of other cardiac implants and grafts: Secondary | ICD-10-CM | POA: Diagnosis not present

## 2022-08-20 DIAGNOSIS — Z95818 Presence of other cardiac implants and grafts: Secondary | ICD-10-CM | POA: Diagnosis not present

## 2022-08-20 DIAGNOSIS — R55 Syncope and collapse: Secondary | ICD-10-CM | POA: Diagnosis not present

## 2022-09-05 DIAGNOSIS — F4323 Adjustment disorder with mixed anxiety and depressed mood: Secondary | ICD-10-CM | POA: Diagnosis not present

## 2022-09-19 DIAGNOSIS — R55 Syncope and collapse: Secondary | ICD-10-CM | POA: Diagnosis not present

## 2022-09-29 ENCOUNTER — Other Ambulatory Visit: Payer: Self-pay | Admitting: Family Medicine

## 2022-09-30 ENCOUNTER — Other Ambulatory Visit: Payer: Self-pay

## 2022-09-30 ENCOUNTER — Encounter: Payer: Self-pay | Admitting: Family Medicine

## 2022-09-30 DIAGNOSIS — F4323 Adjustment disorder with mixed anxiety and depressed mood: Secondary | ICD-10-CM | POA: Diagnosis not present

## 2022-09-30 DIAGNOSIS — M25462 Effusion, left knee: Secondary | ICD-10-CM | POA: Diagnosis not present

## 2022-09-30 DIAGNOSIS — M25562 Pain in left knee: Secondary | ICD-10-CM | POA: Diagnosis not present

## 2022-09-30 NOTE — Telephone Encounter (Signed)
Last fill 05/24/22 last visit 05/27/22.

## 2022-10-01 NOTE — Telephone Encounter (Signed)
done

## 2022-10-11 DIAGNOSIS — M25562 Pain in left knee: Secondary | ICD-10-CM | POA: Diagnosis not present

## 2022-10-18 DIAGNOSIS — M23301 Other meniscus derangements, unspecified lateral meniscus, left knee: Secondary | ICD-10-CM | POA: Diagnosis not present

## 2022-10-21 DIAGNOSIS — R55 Syncope and collapse: Secondary | ICD-10-CM | POA: Diagnosis not present

## 2022-10-21 DIAGNOSIS — Z4509 Encounter for adjustment and management of other cardiac device: Secondary | ICD-10-CM | POA: Diagnosis not present

## 2022-10-22 DIAGNOSIS — Z4509 Encounter for adjustment and management of other cardiac device: Secondary | ICD-10-CM | POA: Diagnosis not present

## 2022-10-22 DIAGNOSIS — R55 Syncope and collapse: Secondary | ICD-10-CM | POA: Diagnosis not present

## 2022-10-23 DIAGNOSIS — Z1389 Encounter for screening for other disorder: Secondary | ICD-10-CM | POA: Diagnosis not present

## 2022-10-23 DIAGNOSIS — K5900 Constipation, unspecified: Secondary | ICD-10-CM | POA: Diagnosis not present

## 2022-10-23 DIAGNOSIS — R300 Dysuria: Secondary | ICD-10-CM | POA: Diagnosis not present

## 2022-10-23 DIAGNOSIS — Z01419 Encounter for gynecological examination (general) (routine) without abnormal findings: Secondary | ICD-10-CM | POA: Diagnosis not present

## 2022-10-25 ENCOUNTER — Other Ambulatory Visit: Payer: Self-pay | Admitting: *Deleted

## 2022-10-25 DIAGNOSIS — R5383 Other fatigue: Secondary | ICD-10-CM

## 2022-10-25 DIAGNOSIS — R0602 Shortness of breath: Secondary | ICD-10-CM

## 2022-10-25 MED ORDER — VALACYCLOVIR HCL 1 G PO TABS: 1000.0000 mg | ORAL_TABLET | Freq: Every day | ORAL | 3 refills | Status: AC

## 2022-11-12 DIAGNOSIS — M23362 Other meniscus derangements, other lateral meniscus, left knee: Secondary | ICD-10-CM | POA: Diagnosis not present

## 2022-11-12 DIAGNOSIS — S83282A Other tear of lateral meniscus, current injury, left knee, initial encounter: Secondary | ICD-10-CM | POA: Diagnosis not present

## 2022-11-12 DIAGNOSIS — M23301 Other meniscus derangements, unspecified lateral meniscus, left knee: Secondary | ICD-10-CM | POA: Diagnosis not present

## 2022-11-13 DIAGNOSIS — Z9889 Other specified postprocedural states: Secondary | ICD-10-CM | POA: Diagnosis not present

## 2022-11-15 DIAGNOSIS — R2689 Other abnormalities of gait and mobility: Secondary | ICD-10-CM | POA: Diagnosis not present

## 2022-11-15 DIAGNOSIS — M25662 Stiffness of left knee, not elsewhere classified: Secondary | ICD-10-CM | POA: Diagnosis not present

## 2022-11-15 DIAGNOSIS — R29898 Other symptoms and signs involving the musculoskeletal system: Secondary | ICD-10-CM | POA: Diagnosis not present

## 2022-11-15 DIAGNOSIS — M23301 Other meniscus derangements, unspecified lateral meniscus, left knee: Secondary | ICD-10-CM | POA: Diagnosis not present

## 2022-11-19 DIAGNOSIS — M25662 Stiffness of left knee, not elsewhere classified: Secondary | ICD-10-CM | POA: Diagnosis not present

## 2022-11-19 DIAGNOSIS — R29898 Other symptoms and signs involving the musculoskeletal system: Secondary | ICD-10-CM | POA: Diagnosis not present

## 2022-11-19 DIAGNOSIS — M23301 Other meniscus derangements, unspecified lateral meniscus, left knee: Secondary | ICD-10-CM | POA: Diagnosis not present

## 2022-11-19 DIAGNOSIS — R2689 Other abnormalities of gait and mobility: Secondary | ICD-10-CM | POA: Diagnosis not present

## 2022-11-22 DIAGNOSIS — R55 Syncope and collapse: Secondary | ICD-10-CM | POA: Diagnosis not present

## 2022-11-26 DIAGNOSIS — M25662 Stiffness of left knee, not elsewhere classified: Secondary | ICD-10-CM | POA: Diagnosis not present

## 2022-11-26 DIAGNOSIS — R2689 Other abnormalities of gait and mobility: Secondary | ICD-10-CM | POA: Diagnosis not present

## 2022-11-26 DIAGNOSIS — M23301 Other meniscus derangements, unspecified lateral meniscus, left knee: Secondary | ICD-10-CM | POA: Diagnosis not present

## 2022-11-27 DIAGNOSIS — Z9889 Other specified postprocedural states: Secondary | ICD-10-CM | POA: Diagnosis not present

## 2022-11-28 DIAGNOSIS — R2689 Other abnormalities of gait and mobility: Secondary | ICD-10-CM | POA: Diagnosis not present

## 2022-11-28 DIAGNOSIS — M25662 Stiffness of left knee, not elsewhere classified: Secondary | ICD-10-CM | POA: Diagnosis not present

## 2022-11-28 DIAGNOSIS — M23301 Other meniscus derangements, unspecified lateral meniscus, left knee: Secondary | ICD-10-CM | POA: Diagnosis not present

## 2022-11-28 DIAGNOSIS — R29898 Other symptoms and signs involving the musculoskeletal system: Secondary | ICD-10-CM | POA: Diagnosis not present

## 2022-12-02 DIAGNOSIS — F4323 Adjustment disorder with mixed anxiety and depressed mood: Secondary | ICD-10-CM | POA: Diagnosis not present

## 2022-12-03 DIAGNOSIS — M25662 Stiffness of left knee, not elsewhere classified: Secondary | ICD-10-CM | POA: Diagnosis not present

## 2022-12-03 DIAGNOSIS — R2689 Other abnormalities of gait and mobility: Secondary | ICD-10-CM | POA: Diagnosis not present

## 2022-12-03 DIAGNOSIS — M23301 Other meniscus derangements, unspecified lateral meniscus, left knee: Secondary | ICD-10-CM | POA: Diagnosis not present

## 2022-12-03 DIAGNOSIS — R29898 Other symptoms and signs involving the musculoskeletal system: Secondary | ICD-10-CM | POA: Diagnosis not present

## 2022-12-05 DIAGNOSIS — M25662 Stiffness of left knee, not elsewhere classified: Secondary | ICD-10-CM | POA: Diagnosis not present

## 2022-12-05 DIAGNOSIS — R29898 Other symptoms and signs involving the musculoskeletal system: Secondary | ICD-10-CM | POA: Diagnosis not present

## 2022-12-05 DIAGNOSIS — M23301 Other meniscus derangements, unspecified lateral meniscus, left knee: Secondary | ICD-10-CM | POA: Diagnosis not present

## 2022-12-05 DIAGNOSIS — K219 Gastro-esophageal reflux disease without esophagitis: Secondary | ICD-10-CM | POA: Diagnosis not present

## 2022-12-05 DIAGNOSIS — R2689 Other abnormalities of gait and mobility: Secondary | ICD-10-CM | POA: Diagnosis not present

## 2022-12-08 DIAGNOSIS — N3001 Acute cystitis with hematuria: Secondary | ICD-10-CM | POA: Diagnosis not present

## 2022-12-08 DIAGNOSIS — R300 Dysuria: Secondary | ICD-10-CM | POA: Diagnosis not present

## 2022-12-10 DIAGNOSIS — R2689 Other abnormalities of gait and mobility: Secondary | ICD-10-CM | POA: Diagnosis not present

## 2022-12-10 DIAGNOSIS — R29898 Other symptoms and signs involving the musculoskeletal system: Secondary | ICD-10-CM | POA: Diagnosis not present

## 2022-12-10 DIAGNOSIS — M23301 Other meniscus derangements, unspecified lateral meniscus, left knee: Secondary | ICD-10-CM | POA: Diagnosis not present

## 2022-12-10 DIAGNOSIS — M25662 Stiffness of left knee, not elsewhere classified: Secondary | ICD-10-CM | POA: Diagnosis not present

## 2022-12-12 DIAGNOSIS — M23301 Other meniscus derangements, unspecified lateral meniscus, left knee: Secondary | ICD-10-CM | POA: Diagnosis not present

## 2022-12-12 DIAGNOSIS — R2689 Other abnormalities of gait and mobility: Secondary | ICD-10-CM | POA: Diagnosis not present

## 2022-12-12 DIAGNOSIS — M25662 Stiffness of left knee, not elsewhere classified: Secondary | ICD-10-CM | POA: Diagnosis not present

## 2022-12-12 DIAGNOSIS — R29898 Other symptoms and signs involving the musculoskeletal system: Secondary | ICD-10-CM | POA: Diagnosis not present

## 2022-12-17 DIAGNOSIS — R29898 Other symptoms and signs involving the musculoskeletal system: Secondary | ICD-10-CM | POA: Diagnosis not present

## 2022-12-17 DIAGNOSIS — M23301 Other meniscus derangements, unspecified lateral meniscus, left knee: Secondary | ICD-10-CM | POA: Diagnosis not present

## 2022-12-17 DIAGNOSIS — R2689 Other abnormalities of gait and mobility: Secondary | ICD-10-CM | POA: Diagnosis not present

## 2022-12-17 DIAGNOSIS — M25662 Stiffness of left knee, not elsewhere classified: Secondary | ICD-10-CM | POA: Diagnosis not present

## 2022-12-19 DIAGNOSIS — M23301 Other meniscus derangements, unspecified lateral meniscus, left knee: Secondary | ICD-10-CM | POA: Diagnosis not present

## 2022-12-19 DIAGNOSIS — M25662 Stiffness of left knee, not elsewhere classified: Secondary | ICD-10-CM | POA: Diagnosis not present

## 2022-12-19 DIAGNOSIS — R29898 Other symptoms and signs involving the musculoskeletal system: Secondary | ICD-10-CM | POA: Diagnosis not present

## 2022-12-19 DIAGNOSIS — R2689 Other abnormalities of gait and mobility: Secondary | ICD-10-CM | POA: Diagnosis not present

## 2022-12-23 DIAGNOSIS — R55 Syncope and collapse: Secondary | ICD-10-CM | POA: Diagnosis not present

## 2022-12-24 DIAGNOSIS — M23301 Other meniscus derangements, unspecified lateral meniscus, left knee: Secondary | ICD-10-CM | POA: Diagnosis not present

## 2022-12-24 DIAGNOSIS — M25662 Stiffness of left knee, not elsewhere classified: Secondary | ICD-10-CM | POA: Diagnosis not present

## 2022-12-24 DIAGNOSIS — R29898 Other symptoms and signs involving the musculoskeletal system: Secondary | ICD-10-CM | POA: Diagnosis not present

## 2022-12-24 DIAGNOSIS — R2689 Other abnormalities of gait and mobility: Secondary | ICD-10-CM | POA: Diagnosis not present

## 2022-12-25 DIAGNOSIS — Z9889 Other specified postprocedural states: Secondary | ICD-10-CM | POA: Diagnosis not present

## 2022-12-26 DIAGNOSIS — R29898 Other symptoms and signs involving the musculoskeletal system: Secondary | ICD-10-CM | POA: Diagnosis not present

## 2022-12-26 DIAGNOSIS — M25662 Stiffness of left knee, not elsewhere classified: Secondary | ICD-10-CM | POA: Diagnosis not present

## 2022-12-26 DIAGNOSIS — M23301 Other meniscus derangements, unspecified lateral meniscus, left knee: Secondary | ICD-10-CM | POA: Diagnosis not present

## 2022-12-26 DIAGNOSIS — R2689 Other abnormalities of gait and mobility: Secondary | ICD-10-CM | POA: Diagnosis not present

## 2022-12-28 ENCOUNTER — Other Ambulatory Visit: Payer: Self-pay | Admitting: Family Medicine

## 2022-12-30 ENCOUNTER — Other Ambulatory Visit: Payer: Self-pay | Admitting: Family Medicine

## 2022-12-30 NOTE — Telephone Encounter (Signed)
Patient called on for refill for temazepam (RESTORIL) 22.5 MG capsule. States that she requested a refill via [employer]. She will be leaving out of town tomorrow Please advise.  [facility] [address] [place], [place]

## 2023-01-01 NOTE — Telephone Encounter (Signed)
It ws filled, 8/13

## 2023-01-02 ENCOUNTER — Telehealth: Payer: Self-pay | Admitting: Family Medicine

## 2023-01-02 MED ORDER — TEMAZEPAM 22.5 MG PO CAPS: ORAL_CAPSULE | ORAL | 0 refills | Status: DC

## 2023-01-02 NOTE — Telephone Encounter (Signed)
Meds ordered this encounter  Medications   temazepam (RESTORIL) 22.5 MG capsule    Sig: TAKE 1 CAPSULE(22.5 MG) BY MOUTH AT BEDTIME AS NEEDED FOR SLEEP    Dispense:  30 capsule    Refill:  0

## 2023-01-06 DIAGNOSIS — Z4509 Encounter for adjustment and management of other cardiac device: Secondary | ICD-10-CM | POA: Diagnosis not present

## 2023-01-06 DIAGNOSIS — R55 Syncope and collapse: Secondary | ICD-10-CM | POA: Diagnosis not present

## 2023-01-09 DIAGNOSIS — R29898 Other symptoms and signs involving the musculoskeletal system: Secondary | ICD-10-CM | POA: Diagnosis not present

## 2023-01-09 DIAGNOSIS — M25662 Stiffness of left knee, not elsewhere classified: Secondary | ICD-10-CM | POA: Diagnosis not present

## 2023-01-09 DIAGNOSIS — R2689 Other abnormalities of gait and mobility: Secondary | ICD-10-CM | POA: Diagnosis not present

## 2023-01-09 DIAGNOSIS — M23301 Other meniscus derangements, unspecified lateral meniscus, left knee: Secondary | ICD-10-CM | POA: Diagnosis not present

## 2023-01-13 DIAGNOSIS — F4323 Adjustment disorder with mixed anxiety and depressed mood: Secondary | ICD-10-CM | POA: Diagnosis not present

## 2023-01-15 DIAGNOSIS — U071 COVID-19: Secondary | ICD-10-CM | POA: Diagnosis not present

## 2023-01-15 DIAGNOSIS — J029 Acute pharyngitis, unspecified: Secondary | ICD-10-CM | POA: Diagnosis not present

## 2023-01-23 DIAGNOSIS — R55 Syncope and collapse: Secondary | ICD-10-CM | POA: Diagnosis not present

## 2023-01-23 DIAGNOSIS — Z4509 Encounter for adjustment and management of other cardiac device: Secondary | ICD-10-CM | POA: Diagnosis not present

## 2023-01-27 DIAGNOSIS — R2689 Other abnormalities of gait and mobility: Secondary | ICD-10-CM | POA: Diagnosis not present

## 2023-01-27 DIAGNOSIS — M23301 Other meniscus derangements, unspecified lateral meniscus, left knee: Secondary | ICD-10-CM | POA: Diagnosis not present

## 2023-01-27 DIAGNOSIS — M25662 Stiffness of left knee, not elsewhere classified: Secondary | ICD-10-CM | POA: Diagnosis not present

## 2023-01-27 DIAGNOSIS — R29898 Other symptoms and signs involving the musculoskeletal system: Secondary | ICD-10-CM | POA: Diagnosis not present

## 2023-01-28 DIAGNOSIS — R002 Palpitations: Secondary | ICD-10-CM | POA: Diagnosis not present

## 2023-01-28 DIAGNOSIS — Z8679 Personal history of other diseases of the circulatory system: Secondary | ICD-10-CM | POA: Diagnosis not present

## 2023-01-28 DIAGNOSIS — Z95818 Presence of other cardiac implants and grafts: Secondary | ICD-10-CM | POA: Diagnosis not present

## 2023-01-28 DIAGNOSIS — R55 Syncope and collapse: Secondary | ICD-10-CM | POA: Diagnosis not present

## 2023-01-29 DIAGNOSIS — R002 Palpitations: Secondary | ICD-10-CM | POA: Diagnosis not present

## 2023-01-31 ENCOUNTER — Other Ambulatory Visit: Payer: Self-pay | Admitting: Family Medicine

## 2023-01-31 NOTE — Telephone Encounter (Signed)
Pt called.  She is following up on refill. Can she get 90-120 days on her refills?

## 2023-02-06 ENCOUNTER — Other Ambulatory Visit: Payer: Self-pay | Admitting: Family Medicine

## 2023-02-06 MED ORDER — TEMAZEPAM 22.5 MG PO CAPS: ORAL_CAPSULE | ORAL | 1 refills | Status: DC

## 2023-02-06 NOTE — Telephone Encounter (Signed)
Sent to pcp to be reviewed and signed

## 2023-02-06 NOTE — Telephone Encounter (Signed)
Meds ordered this encounter  Medications   temazepam (RESTORIL) 22.5 MG capsule    Sig: TAKE 1 CAPSULE(22.5 MG) BY MOUTH AT BEDTIME AS NEEDED FOR SLEEP    Dispense:  90 capsule    Refill:  1

## 2023-02-06 NOTE — Telephone Encounter (Signed)
Patient's temazepam 22.5mg  was sent to the wrong pharmacy it was sent to the [facility] in [facility] while patient was traveling she is back in the town and needs it resubmitted to [facility] [phone]

## 2023-02-06 NOTE — Addendum Note (Signed)
Addended by: Nani Gasser D on: 02/06/2023 02:14 PM   Modules accepted: Orders

## 2023-02-06 NOTE — Addendum Note (Signed)
Addended by: Deno Etienne on: 02/06/2023 12:42 PM   Modules accepted: Orders

## 2023-02-10 DIAGNOSIS — F4323 Adjustment disorder with mixed anxiety and depressed mood: Secondary | ICD-10-CM | POA: Diagnosis not present

## 2023-02-10 DIAGNOSIS — H9201 Otalgia, right ear: Secondary | ICD-10-CM | POA: Diagnosis not present

## 2023-02-10 DIAGNOSIS — H6693 Otitis media, unspecified, bilateral: Secondary | ICD-10-CM | POA: Diagnosis not present

## 2023-02-26 DIAGNOSIS — Z8679 Personal history of other diseases of the circulatory system: Secondary | ICD-10-CM | POA: Diagnosis not present

## 2023-02-26 DIAGNOSIS — R002 Palpitations: Secondary | ICD-10-CM | POA: Diagnosis not present

## 2023-02-26 DIAGNOSIS — Z4509 Encounter for adjustment and management of other cardiac device: Secondary | ICD-10-CM | POA: Diagnosis not present

## 2023-02-26 DIAGNOSIS — R55 Syncope and collapse: Secondary | ICD-10-CM | POA: Diagnosis not present

## 2023-02-26 DIAGNOSIS — I498 Other specified cardiac arrhythmias: Secondary | ICD-10-CM | POA: Diagnosis not present

## 2023-03-18 DIAGNOSIS — F4323 Adjustment disorder with mixed anxiety and depressed mood: Secondary | ICD-10-CM | POA: Diagnosis not present

## 2023-04-01 DIAGNOSIS — R55 Syncope and collapse: Secondary | ICD-10-CM | POA: Diagnosis not present

## 2023-04-04 DIAGNOSIS — Z4509 Encounter for adjustment and management of other cardiac device: Secondary | ICD-10-CM | POA: Diagnosis not present

## 2023-04-25 DIAGNOSIS — F4323 Adjustment disorder with mixed anxiety and depressed mood: Secondary | ICD-10-CM | POA: Diagnosis not present

## 2023-05-02 DIAGNOSIS — R55 Syncope and collapse: Secondary | ICD-10-CM | POA: Diagnosis not present

## 2023-07-25 ENCOUNTER — Encounter: Payer: Self-pay | Admitting: Pulmonary Disease

## 2023-08-12 ENCOUNTER — Other Ambulatory Visit: Payer: Self-pay | Admitting: Obstetrics and Gynecology

## 2023-08-12 DIAGNOSIS — Z1231 Encounter for screening mammogram for malignant neoplasm of breast: Secondary | ICD-10-CM

## 2023-08-16 ENCOUNTER — Other Ambulatory Visit: Payer: Self-pay | Admitting: Family Medicine

## 2023-08-20 ENCOUNTER — Telehealth: Payer: Self-pay

## 2023-08-20 NOTE — Telephone Encounter (Signed)
 Copied from CRM [id number]. Topic: Clinical - Prescription Issue >> Aug 20, 2023 10:41 AM Antony Haste wrote: Reason for CRM: The patient contacted [facility] to determine if her medication is ready to be picked up. She was advised they faxed over a request of Temazepam on 03/29 but haven't received approval for her refill. She is requesting for temazepam (RESTORIL) 22.5 MG capsule to be signed off before the end of the day, if possible. Callback [phone]

## 2023-08-20 NOTE — Telephone Encounter (Signed)
 sent

## 2023-08-20 NOTE — Telephone Encounter (Signed)
 Requesting rx rf of Temazepam 22.5mg  Last written 02/06/2023 Last OV 05/27/2022 Upcoming appt = none

## 2023-08-21 NOTE — Telephone Encounter (Signed)
 Patient informed.

## 2023-09-19 IMAGING — MG MM  DIGITAL DIAGNOSTIC BREAST BILAT IMPLANT W/ TOMO W/ CAD
8 of 15 series · 8 of 35 positions shown · non-contrast
Comparison: Previous exam(s).

CLINICAL DATA: 1+ year interval follow-up of likely benign one-view
asymmetries in both breasts.

EXAM:
DIGITAL DIAGNOSTIC BILATERAL MAMMOGRAM WITH IMPLANTS, CAD AND
TOMOSYNTHESIS
TECHNIQUE: Bilateral digital diagnostic mammography and breast tomosynthesis
was performed. The images were evaluated with computer-aided
detection. Standard and/or implant displaced views were performed.

[L CC (1 of 2)]
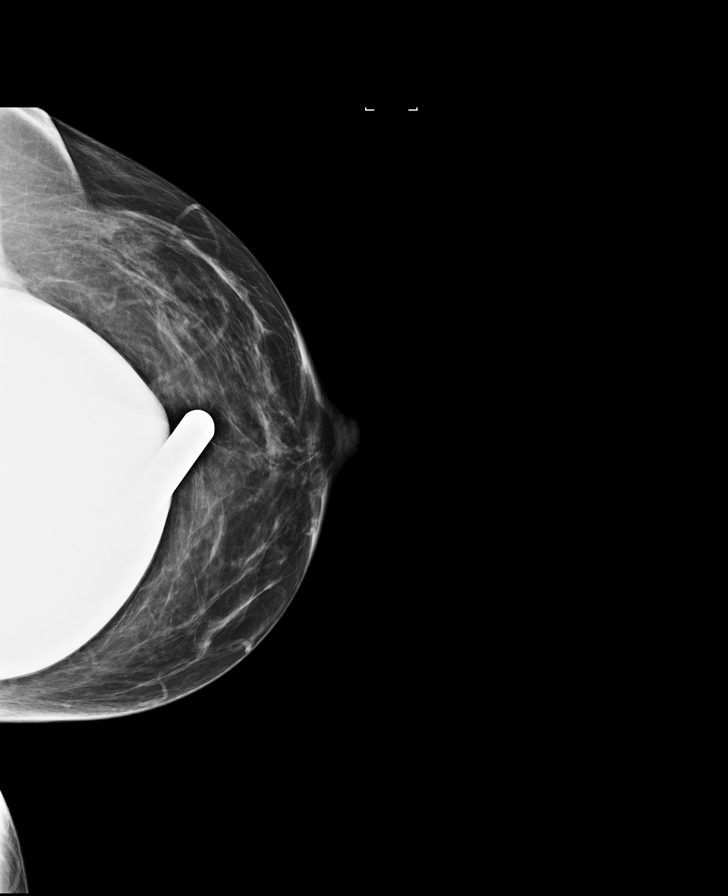

[R MLO]
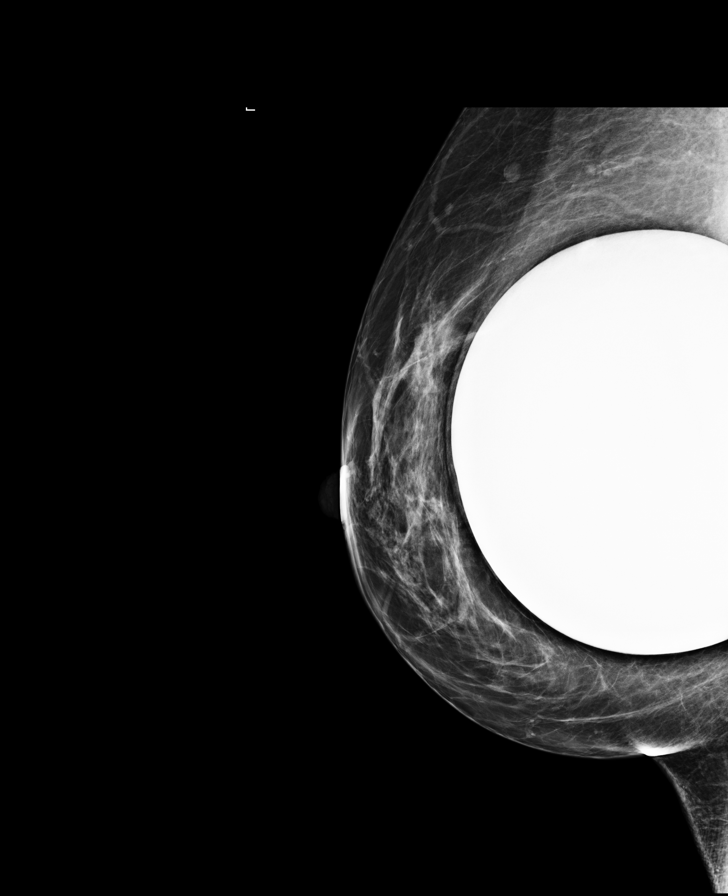

[R CC]
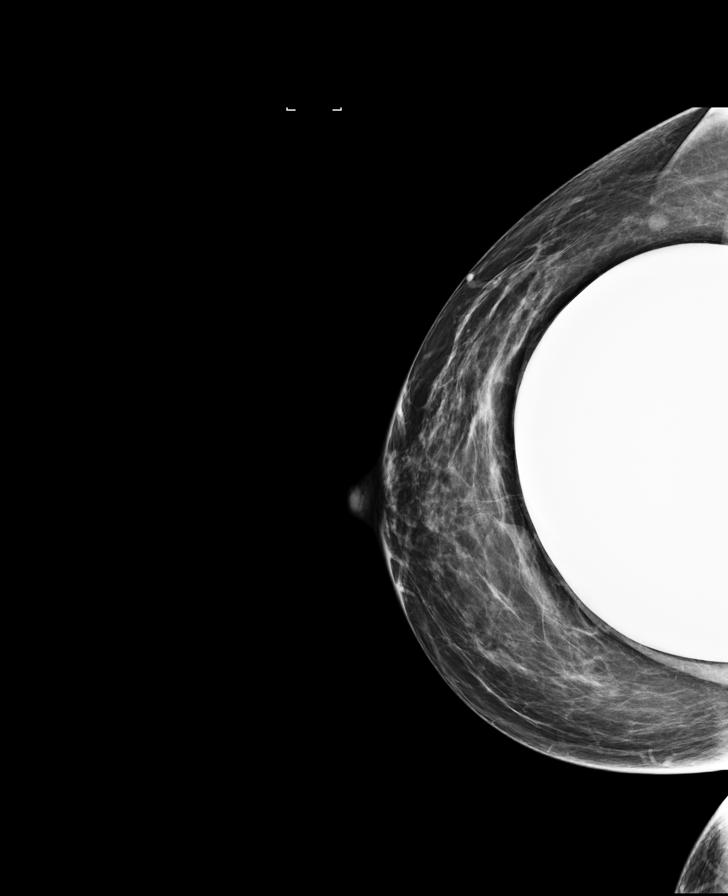

[L MLO]
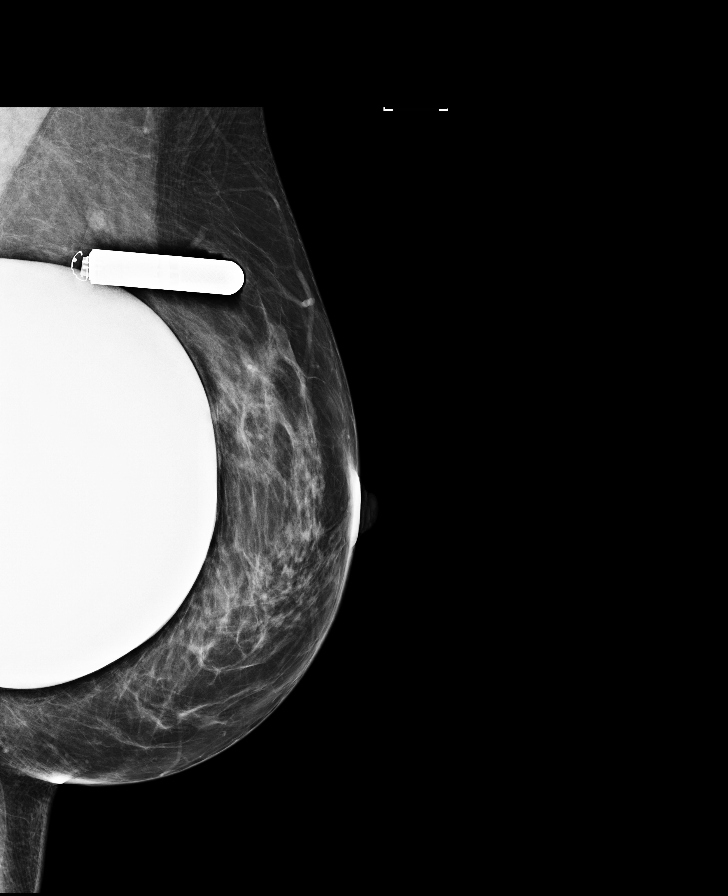

[L CC (2 of 2)]
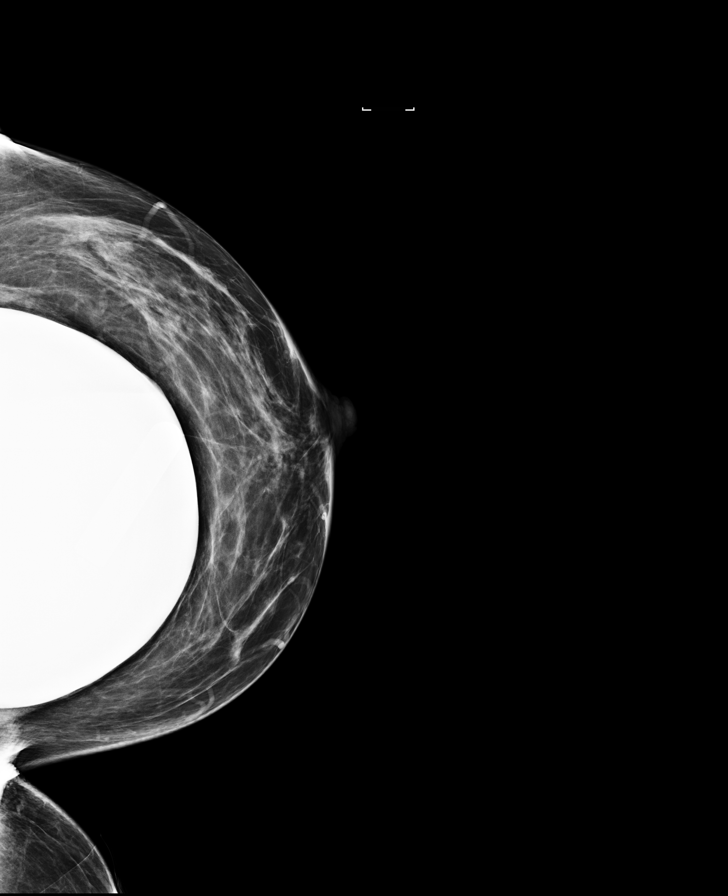

[L CC synth-2D]
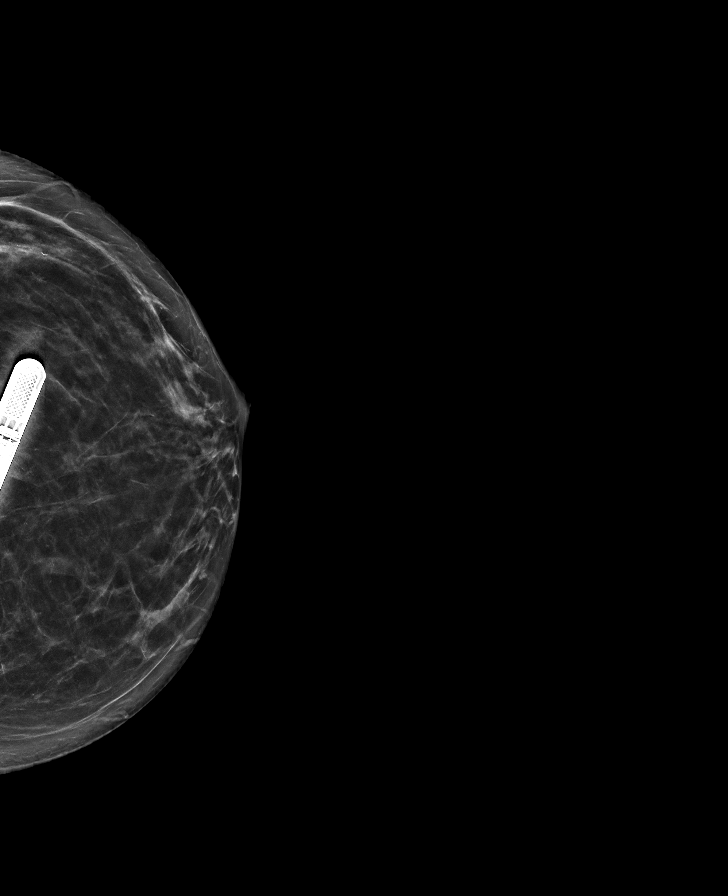

[R CC synth-2D]
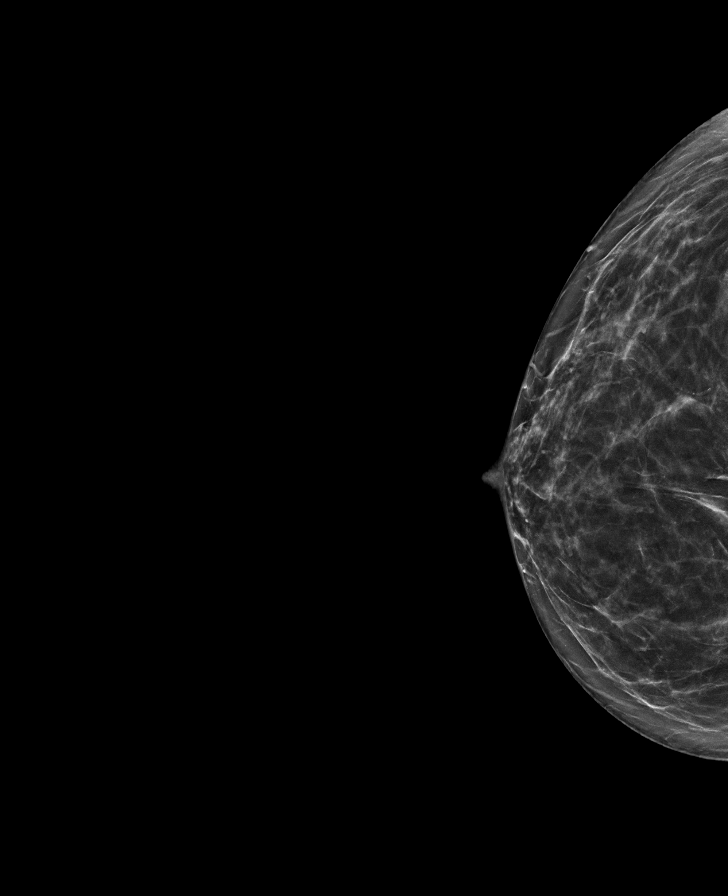

[L MLO synth-2D]
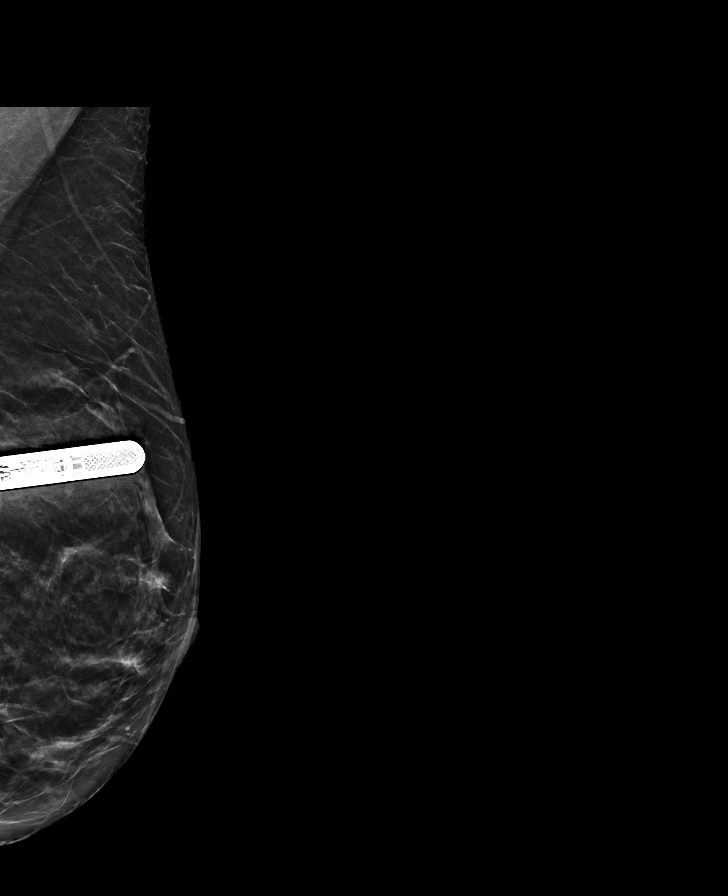

[8 of 35 positions shown; findings below may reference images not displayed]

ACR Breast Density Category b: There are scattered areas of
fibroglandular density.
FINDINGS: The patient has retropectoral implants. Full field CC and MLO views
of both breasts, implant displaced CC and MLO views of both breasts
with tomosynthesis, and a spot compression MLO view of the
previously identified area of concern in the LEFT breast were
obtained.

RIGHT: No findings suspicious for malignancy. The asymmetry in the
upper breast visible only on the MLO view on the prior mammograms is
much less conspicuous currently and represents normal fibroglandular
tissue.

LEFT: No findings suspicious for malignancy. The asymmetry in the
central breast visible only on the MLO view on the prior mammograms
is less conspicuous currently and represents overlapping
fibroglandular tissue.
IMPRESSION: No mammographic evidence of malignancy involving either breast.

RECOMMENDATION:
Screening mammogram in one year.(Code:VK-5-4WB)

I have discussed the findings and recommendations with the patient.
If applicable, a reminder letter will be sent to the patient
regarding the next appointment.

BI-RADS CATEGORY  1: Negative.

## 2024-03-01 ENCOUNTER — Other Ambulatory Visit: Payer: Self-pay | Admitting: Family Medicine

## 2024-03-01 NOTE — Telephone Encounter (Signed)
 Please call pt she will need a f/u for medication refills.

## 2024-03-01 NOTE — Telephone Encounter (Signed)
 Patient has scheduled an appointment for 04/19/2024 at 10:30. Patient is requesting a refill to hold her over until her appointment. Please advise?

## 2024-03-03 NOTE — Telephone Encounter (Signed)
 Last written on 08/20/2023 #90 Last filled on 12/02/2023  Last OV 05/27/2022 Next OV 04/19/2024

## 2024-04-19 ENCOUNTER — Ambulatory Visit (INDEPENDENT_AMBULATORY_CARE_PROVIDER_SITE_OTHER): Admitting: Family Medicine

## 2024-04-19 ENCOUNTER — Encounter: Payer: Self-pay | Admitting: Family Medicine

## 2024-04-19 VITALS — BP 131/76 | HR 76 | Ht 64.0 in | Wt 135.6 lb

## 2024-04-19 DIAGNOSIS — J069 Acute upper respiratory infection, unspecified: Secondary | ICD-10-CM

## 2024-04-19 DIAGNOSIS — F5101 Primary insomnia: Secondary | ICD-10-CM

## 2024-04-19 DIAGNOSIS — F430 Acute stress reaction: Secondary | ICD-10-CM

## 2024-04-19 MED ORDER — BUSPIRONE HCL 5 MG PO TABS: 5.0000 mg | ORAL_TABLET | Freq: Three times a day (TID) | ORAL | 1 refills | Status: AC

## 2024-04-19 MED ORDER — TEMAZEPAM 22.5 MG PO CAPS: ORAL_CAPSULE | ORAL | 1 refills | Status: AC

## 2024-04-19 NOTE — Progress Notes (Signed)
 SABRA

## 2024-04-19 NOTE — Progress Notes (Signed)
 Established Patient Office Visit  Patient ID: [name], female    DOB: [birth year]  Age: 53 y.o. MRN: [id number] PCP: Alvan Dorothyann BIRCH, MD  Chief Complaint  Patient presents with   Medical Management of Chronic Issues    Subjective:     HPI  Discussed the use of AI scribe software for clinical note transcription with the patient, who gave verbal consent to proceed.  History of Present Illness [name] is a 53 year old female who presents with sleep disturbances and anxiety management.  Sleep disturbance - Significant difficulty with sleep initiation and maintenance - Frequently awakens at 3 AM or 5 AM with racing thoughts - Currently taking sleep medications, which improve sleep but do not resolve early morning awakenings or overthinking  Anxiety and emotional distress - Persistent anxiety related to caregiving responsibilities for two grandchildren - Frequent emotional breakdowns due to overwhelming stress - Anxiety exacerbated by recent separation from boyfriend, who left due to stress of situation - Overthinking and anxiety most pronounced in early morning hours  Upper respiratory symptoms - Five-day history of symptoms consistent with rhinovirus infection - Tightness and sinus pressure present - DayQuil provides symptomatic relief  Household illness exposure - Cycle of illness within household, with frequent transmission of infections - Autistic grandson frequently brings home illnesses from school - Grandson with persistent nasal congestion for three weeks, unresponsive to multiple over-the-counter medications  Caregiver stress related to grandchildren's medical conditions - Four-year-old grandson diagnosed with autism - Infant grandson diagnosed with acute lymphoblastic leukemia, currently hospitalized with neutropenia and recurrent infections     ROS    Objective:     BP 131/76   Pulse 76   Ht 5' 4 (1.626 m)   Wt 135 lb 9.6 oz  (61.5 kg)   SpO2 98%   BMI 23.28 kg/m    Physical Exam Vitals and nursing note reviewed.  Constitutional:      Appearance: Normal appearance.  HENT:     Head: Normocephalic and atraumatic.  Eyes:     Conjunctiva/sclera: Conjunctivae normal.  Cardiovascular:     Rate and Rhythm: Normal rate and regular rhythm.  Pulmonary:     Effort: Pulmonary effort is normal.     Breath sounds: Normal breath sounds.  Skin:    General: Skin is warm and dry.  Neurological:     Mental Status: She is alert.  Psychiatric:        Mood and Affect: Mood normal.      No results found for any visits on 04/19/24.    The ASCVD Risk score (Arnett DK, et al., 2019) failed to calculate for the following reasons:   Cannot find a previous HDL lab   Cannot find a previous total cholesterol lab    Assessment & Plan:   Problem List Items Addressed This Visit       Other   Primary insomnia   Relevant Medications   temazepam  (RESTORIL ) 22.5 MG capsule   Other Visit Diagnoses       Upper respiratory tract infection, unspecified type    -  Primary     Acute stress reaction       Relevant Medications   busPIRone (BUSPAR) 5 MG tablet       Assessment and Plan Assessment & Plan Primary insomnia Chronic insomnia managed with diazepam   - Continue temazepam  for insomnia management.  Adjustment disorder with anxiety Significant anxiety due to life stressors. Buspirone chosen for short-term management. -  Initiated buspirone twice daily, with option for midday dose as needed. - Reassess in one week for dosage adjustment.  Acute upper respiratory infection Symptoms consistent with viral infection, likely rhinovirus. No severe symptoms warranting steroids. - Continue DayQuil for symptom relief. - Consider saline irrigation to prevent sinus infection. - Monitor symptoms and contact if condition worsens.  General Health Maintenance Received flu vaccination. Declined pneumonia vaccination.  Recent mammogram normal.    No follow-ups on file.    Dorothyann Byars, MD [facility] at [facility]

## 2024-06-25 ENCOUNTER — Telehealth: Payer: Self-pay

## 2024-06-25 NOTE — Telephone Encounter (Signed)
 Copied from CRM #[id number]. Topic: General - Other >> Jun 25, 2024 10:50 AM [name] wrote: Reason for CRM: Patient states she is trying to apply for [employer] and its temporarily rejected because Dr Alvan requested a sleep study a few years ago. States doesn't have sleep apnea just cannot sleep due to her brain not shutting off. Because of the sleep study was not completed the insurance company is requesting a note from Dr Alvan that says the patient is in good health and doesn't need the study for sleep apnea.   Patient can be reached at [phone]
